# Patient Record
Sex: Female | Born: 1945 | Race: White | Hispanic: No | Smoking: Never smoker
Health system: Southern US, Community
[De-identification: ages and names within clinical notes are randomized; demographics above are authoritative.]

## PROBLEM LIST (undated history)

## (undated) DIAGNOSIS — I499 Cardiac arrhythmia, unspecified: Secondary | ICD-10-CM

## (undated) DIAGNOSIS — E785 Hyperlipidemia, unspecified: Secondary | ICD-10-CM

## (undated) DIAGNOSIS — I10 Essential (primary) hypertension: Secondary | ICD-10-CM

## (undated) DIAGNOSIS — K648 Other hemorrhoids: Secondary | ICD-10-CM

## (undated) DIAGNOSIS — C20 Malignant neoplasm of rectum: Secondary | ICD-10-CM

## (undated) DIAGNOSIS — M1990 Unspecified osteoarthritis, unspecified site: Secondary | ICD-10-CM

## (undated) DIAGNOSIS — G8929 Other chronic pain: Secondary | ICD-10-CM

## (undated) DIAGNOSIS — K219 Gastro-esophageal reflux disease without esophagitis: Secondary | ICD-10-CM

## (undated) HISTORY — DX: Gastro-esophageal reflux disease without esophagitis: K21.9

## (undated) HISTORY — PX: CHOLECYSTECTOMY: SHX55

## (undated) HISTORY — DX: Malignant neoplasm of rectum: C20

## (undated) HISTORY — DX: Hyperlipidemia, unspecified: E78.5

## (undated) HISTORY — DX: Essential (primary) hypertension: I10

## (undated) HISTORY — PX: BACK SURGERY: SHX140

## (undated) HISTORY — PX: PARTIAL KNEE ARTHROPLASTY: SHX2174

## (undated) HISTORY — DX: Unspecified osteoarthritis, unspecified site: M19.90

## (undated) HISTORY — DX: Other hemorrhoids: K64.8

## (undated) HISTORY — PX: TUBAL LIGATION: SHX77

## (undated) HISTORY — PX: COLON RESECTION: SHX5231

## (undated) HISTORY — DX: Other chronic pain: G89.29

## (undated) HISTORY — DX: Cardiac arrhythmia, unspecified: I49.9

## (undated) HISTORY — PX: EYE SURGERY: SHX253

---

## 2003-01-21 HISTORY — PX: ESOPHAGOGASTRODUODENOSCOPY: SHX1529

## 2010-10-06 DIAGNOSIS — Z85048 Personal history of other malignant neoplasm of rectum, rectosigmoid junction, and anus: Secondary | ICD-10-CM | POA: Insufficient documentation

## 2010-10-06 HISTORY — DX: Personal history of other malignant neoplasm of rectum, rectosigmoid junction, and anus: Z85.048

## 2014-05-26 DIAGNOSIS — I4891 Unspecified atrial fibrillation: Secondary | ICD-10-CM | POA: Diagnosis not present

## 2014-05-26 DIAGNOSIS — I10 Essential (primary) hypertension: Secondary | ICD-10-CM | POA: Diagnosis not present

## 2014-05-26 DIAGNOSIS — E780 Pure hypercholesterolemia: Secondary | ICD-10-CM | POA: Diagnosis not present

## 2014-05-27 DIAGNOSIS — N858 Other specified noninflammatory disorders of uterus: Secondary | ICD-10-CM | POA: Diagnosis not present

## 2014-05-27 DIAGNOSIS — N950 Postmenopausal bleeding: Secondary | ICD-10-CM | POA: Diagnosis not present

## 2014-05-27 DIAGNOSIS — I4891 Unspecified atrial fibrillation: Secondary | ICD-10-CM | POA: Diagnosis not present

## 2014-06-02 DIAGNOSIS — H259 Unspecified age-related cataract: Secondary | ICD-10-CM | POA: Diagnosis not present

## 2014-06-02 DIAGNOSIS — H2511 Age-related nuclear cataract, right eye: Secondary | ICD-10-CM | POA: Diagnosis not present

## 2014-06-02 DIAGNOSIS — I10 Essential (primary) hypertension: Secondary | ICD-10-CM | POA: Diagnosis not present

## 2014-06-02 DIAGNOSIS — H25811 Combined forms of age-related cataract, right eye: Secondary | ICD-10-CM | POA: Diagnosis not present

## 2014-06-02 DIAGNOSIS — Z79899 Other long term (current) drug therapy: Secondary | ICD-10-CM | POA: Diagnosis not present

## 2014-06-02 DIAGNOSIS — I4891 Unspecified atrial fibrillation: Secondary | ICD-10-CM | POA: Diagnosis not present

## 2014-07-14 DIAGNOSIS — H2512 Age-related nuclear cataract, left eye: Secondary | ICD-10-CM | POA: Diagnosis not present

## 2014-07-14 DIAGNOSIS — G8929 Other chronic pain: Secondary | ICD-10-CM | POA: Diagnosis not present

## 2014-07-14 DIAGNOSIS — H259 Unspecified age-related cataract: Secondary | ICD-10-CM | POA: Diagnosis not present

## 2014-07-14 DIAGNOSIS — E785 Hyperlipidemia, unspecified: Secondary | ICD-10-CM | POA: Diagnosis not present

## 2014-07-14 DIAGNOSIS — Z7982 Long term (current) use of aspirin: Secondary | ICD-10-CM | POA: Diagnosis not present

## 2014-07-14 DIAGNOSIS — H25812 Combined forms of age-related cataract, left eye: Secondary | ICD-10-CM | POA: Diagnosis not present

## 2014-07-14 DIAGNOSIS — Z79899 Other long term (current) drug therapy: Secondary | ICD-10-CM | POA: Diagnosis not present

## 2014-07-14 DIAGNOSIS — I4891 Unspecified atrial fibrillation: Secondary | ICD-10-CM | POA: Diagnosis not present

## 2014-07-14 DIAGNOSIS — K219 Gastro-esophageal reflux disease without esophagitis: Secondary | ICD-10-CM | POA: Diagnosis not present

## 2014-07-16 DIAGNOSIS — T1511XA Foreign body in conjunctival sac, right eye, initial encounter: Secondary | ICD-10-CM | POA: Diagnosis not present

## 2014-09-15 DIAGNOSIS — M5126 Other intervertebral disc displacement, lumbar region: Secondary | ICD-10-CM | POA: Diagnosis not present

## 2014-09-15 DIAGNOSIS — E780 Pure hypercholesterolemia: Secondary | ICD-10-CM | POA: Diagnosis not present

## 2014-09-15 DIAGNOSIS — I4891 Unspecified atrial fibrillation: Secondary | ICD-10-CM | POA: Diagnosis not present

## 2014-09-15 DIAGNOSIS — I10 Essential (primary) hypertension: Secondary | ICD-10-CM | POA: Diagnosis not present

## 2014-11-16 DIAGNOSIS — E669 Obesity, unspecified: Secondary | ICD-10-CM | POA: Diagnosis not present

## 2014-11-16 DIAGNOSIS — M4806 Spinal stenosis, lumbar region: Secondary | ICD-10-CM | POA: Diagnosis not present

## 2014-11-16 DIAGNOSIS — Z23 Encounter for immunization: Secondary | ICD-10-CM | POA: Diagnosis not present

## 2014-11-16 DIAGNOSIS — I4891 Unspecified atrial fibrillation: Secondary | ICD-10-CM | POA: Diagnosis not present

## 2014-11-16 DIAGNOSIS — Z6834 Body mass index (BMI) 34.0-34.9, adult: Secondary | ICD-10-CM | POA: Diagnosis not present

## 2014-11-16 DIAGNOSIS — Z0000 Encounter for general adult medical examination without abnormal findings: Secondary | ICD-10-CM | POA: Diagnosis not present

## 2014-11-25 DIAGNOSIS — Z1231 Encounter for screening mammogram for malignant neoplasm of breast: Secondary | ICD-10-CM | POA: Diagnosis not present

## 2014-12-10 DIAGNOSIS — M1712 Unilateral primary osteoarthritis, left knee: Secondary | ICD-10-CM | POA: Diagnosis not present

## 2014-12-10 DIAGNOSIS — M25562 Pain in left knee: Secondary | ICD-10-CM | POA: Diagnosis not present

## 2014-12-11 DIAGNOSIS — M1712 Unilateral primary osteoarthritis, left knee: Secondary | ICD-10-CM

## 2014-12-11 HISTORY — DX: Unilateral primary osteoarthritis, left knee: M17.12

## 2014-12-15 DIAGNOSIS — I119 Hypertensive heart disease without heart failure: Secondary | ICD-10-CM | POA: Diagnosis not present

## 2014-12-15 DIAGNOSIS — I482 Chronic atrial fibrillation: Secondary | ICD-10-CM | POA: Diagnosis not present

## 2014-12-15 DIAGNOSIS — Z01810 Encounter for preprocedural cardiovascular examination: Secondary | ICD-10-CM | POA: Diagnosis not present

## 2014-12-30 DIAGNOSIS — Z01810 Encounter for preprocedural cardiovascular examination: Secondary | ICD-10-CM | POA: Diagnosis not present

## 2014-12-30 DIAGNOSIS — Z23 Encounter for immunization: Secondary | ICD-10-CM | POA: Diagnosis not present

## 2014-12-30 DIAGNOSIS — M1712 Unilateral primary osteoarthritis, left knee: Secondary | ICD-10-CM | POA: Diagnosis not present

## 2014-12-30 DIAGNOSIS — I10 Essential (primary) hypertension: Secondary | ICD-10-CM | POA: Diagnosis not present

## 2015-01-20 DIAGNOSIS — M25562 Pain in left knee: Secondary | ICD-10-CM | POA: Diagnosis not present

## 2015-01-20 DIAGNOSIS — M1712 Unilateral primary osteoarthritis, left knee: Secondary | ICD-10-CM | POA: Diagnosis not present

## 2015-01-20 DIAGNOSIS — Z01818 Encounter for other preprocedural examination: Secondary | ICD-10-CM

## 2015-01-20 HISTORY — DX: Encounter for other preprocedural examination: Z01.818

## 2015-02-01 DIAGNOSIS — M1712 Unilateral primary osteoarthritis, left knee: Secondary | ICD-10-CM | POA: Diagnosis not present

## 2015-02-09 DIAGNOSIS — E785 Hyperlipidemia, unspecified: Secondary | ICD-10-CM | POA: Diagnosis not present

## 2015-02-09 DIAGNOSIS — M1712 Unilateral primary osteoarthritis, left knee: Secondary | ICD-10-CM | POA: Diagnosis not present

## 2015-02-09 DIAGNOSIS — R262 Difficulty in walking, not elsewhere classified: Secondary | ICD-10-CM | POA: Diagnosis not present

## 2015-02-09 DIAGNOSIS — N3281 Overactive bladder: Secondary | ICD-10-CM | POA: Diagnosis not present

## 2015-02-09 DIAGNOSIS — K219 Gastro-esophageal reflux disease without esophagitis: Secondary | ICD-10-CM | POA: Diagnosis not present

## 2015-02-09 DIAGNOSIS — Z7982 Long term (current) use of aspirin: Secondary | ICD-10-CM | POA: Diagnosis not present

## 2015-02-09 DIAGNOSIS — Z85048 Personal history of other malignant neoplasm of rectum, rectosigmoid junction, and anus: Secondary | ICD-10-CM | POA: Diagnosis not present

## 2015-02-09 DIAGNOSIS — Z96652 Presence of left artificial knee joint: Secondary | ICD-10-CM | POA: Diagnosis not present

## 2015-02-09 DIAGNOSIS — I10 Essential (primary) hypertension: Secondary | ICD-10-CM | POA: Diagnosis not present

## 2015-02-09 DIAGNOSIS — Z471 Aftercare following joint replacement surgery: Secondary | ICD-10-CM | POA: Diagnosis not present

## 2015-02-09 DIAGNOSIS — I119 Hypertensive heart disease without heart failure: Secondary | ICD-10-CM | POA: Diagnosis not present

## 2015-02-09 DIAGNOSIS — I482 Chronic atrial fibrillation: Secondary | ICD-10-CM | POA: Diagnosis not present

## 2015-02-12 DIAGNOSIS — I10 Essential (primary) hypertension: Secondary | ICD-10-CM | POA: Diagnosis not present

## 2015-02-12 DIAGNOSIS — G8918 Other acute postprocedural pain: Secondary | ICD-10-CM | POA: Diagnosis not present

## 2015-02-12 DIAGNOSIS — Z4789 Encounter for other orthopedic aftercare: Secondary | ICD-10-CM | POA: Diagnosis not present

## 2015-02-12 DIAGNOSIS — M1712 Unilateral primary osteoarthritis, left knee: Secondary | ICD-10-CM | POA: Diagnosis not present

## 2015-02-12 DIAGNOSIS — Z96652 Presence of left artificial knee joint: Secondary | ICD-10-CM | POA: Diagnosis not present

## 2015-02-12 DIAGNOSIS — I119 Hypertensive heart disease without heart failure: Secondary | ICD-10-CM | POA: Diagnosis not present

## 2015-02-12 DIAGNOSIS — R262 Difficulty in walking, not elsewhere classified: Secondary | ICD-10-CM | POA: Diagnosis not present

## 2015-02-12 DIAGNOSIS — Z471 Aftercare following joint replacement surgery: Secondary | ICD-10-CM | POA: Diagnosis not present

## 2015-02-15 DIAGNOSIS — Z4789 Encounter for other orthopedic aftercare: Secondary | ICD-10-CM | POA: Diagnosis not present

## 2015-02-15 DIAGNOSIS — R262 Difficulty in walking, not elsewhere classified: Secondary | ICD-10-CM | POA: Diagnosis not present

## 2015-02-15 DIAGNOSIS — G8918 Other acute postprocedural pain: Secondary | ICD-10-CM | POA: Diagnosis not present

## 2015-02-15 DIAGNOSIS — I119 Hypertensive heart disease without heart failure: Secondary | ICD-10-CM | POA: Diagnosis not present

## 2015-02-23 DIAGNOSIS — Z96652 Presence of left artificial knee joint: Secondary | ICD-10-CM | POA: Diagnosis not present

## 2015-02-23 DIAGNOSIS — Z471 Aftercare following joint replacement surgery: Secondary | ICD-10-CM | POA: Diagnosis not present

## 2015-02-24 DIAGNOSIS — M25562 Pain in left knee: Secondary | ICD-10-CM | POA: Diagnosis not present

## 2015-02-24 DIAGNOSIS — M25662 Stiffness of left knee, not elsewhere classified: Secondary | ICD-10-CM | POA: Diagnosis not present

## 2015-02-26 DIAGNOSIS — M25662 Stiffness of left knee, not elsewhere classified: Secondary | ICD-10-CM | POA: Diagnosis not present

## 2015-02-26 DIAGNOSIS — M25562 Pain in left knee: Secondary | ICD-10-CM | POA: Diagnosis not present

## 2015-03-01 DIAGNOSIS — M25562 Pain in left knee: Secondary | ICD-10-CM | POA: Diagnosis not present

## 2015-03-01 DIAGNOSIS — M25662 Stiffness of left knee, not elsewhere classified: Secondary | ICD-10-CM | POA: Diagnosis not present

## 2015-03-03 DIAGNOSIS — M25562 Pain in left knee: Secondary | ICD-10-CM | POA: Diagnosis not present

## 2015-03-03 DIAGNOSIS — M25662 Stiffness of left knee, not elsewhere classified: Secondary | ICD-10-CM | POA: Diagnosis not present

## 2015-03-05 DIAGNOSIS — M25662 Stiffness of left knee, not elsewhere classified: Secondary | ICD-10-CM | POA: Diagnosis not present

## 2015-03-05 DIAGNOSIS — M25562 Pain in left knee: Secondary | ICD-10-CM | POA: Diagnosis not present

## 2015-03-08 DIAGNOSIS — M25662 Stiffness of left knee, not elsewhere classified: Secondary | ICD-10-CM | POA: Diagnosis not present

## 2015-03-08 DIAGNOSIS — I10 Essential (primary) hypertension: Secondary | ICD-10-CM | POA: Diagnosis not present

## 2015-03-08 DIAGNOSIS — M25562 Pain in left knee: Secondary | ICD-10-CM | POA: Diagnosis not present

## 2015-03-08 DIAGNOSIS — I4891 Unspecified atrial fibrillation: Secondary | ICD-10-CM | POA: Diagnosis not present

## 2015-03-08 DIAGNOSIS — Z23 Encounter for immunization: Secondary | ICD-10-CM | POA: Diagnosis not present

## 2015-03-08 DIAGNOSIS — M1712 Unilateral primary osteoarthritis, left knee: Secondary | ICD-10-CM | POA: Diagnosis not present

## 2015-03-08 DIAGNOSIS — E7800 Pure hypercholesterolemia, unspecified: Secondary | ICD-10-CM | POA: Diagnosis not present

## 2015-03-10 DIAGNOSIS — M25662 Stiffness of left knee, not elsewhere classified: Secondary | ICD-10-CM | POA: Diagnosis not present

## 2015-03-10 DIAGNOSIS — M25562 Pain in left knee: Secondary | ICD-10-CM | POA: Diagnosis not present

## 2015-03-12 DIAGNOSIS — M25662 Stiffness of left knee, not elsewhere classified: Secondary | ICD-10-CM | POA: Diagnosis not present

## 2015-03-12 DIAGNOSIS — M25562 Pain in left knee: Secondary | ICD-10-CM | POA: Diagnosis not present

## 2015-03-15 DIAGNOSIS — M25562 Pain in left knee: Secondary | ICD-10-CM | POA: Diagnosis not present

## 2015-03-15 DIAGNOSIS — M25662 Stiffness of left knee, not elsewhere classified: Secondary | ICD-10-CM | POA: Diagnosis not present

## 2015-03-17 DIAGNOSIS — M25662 Stiffness of left knee, not elsewhere classified: Secondary | ICD-10-CM | POA: Diagnosis not present

## 2015-03-17 DIAGNOSIS — M25562 Pain in left knee: Secondary | ICD-10-CM | POA: Diagnosis not present

## 2015-03-19 DIAGNOSIS — M25662 Stiffness of left knee, not elsewhere classified: Secondary | ICD-10-CM | POA: Diagnosis not present

## 2015-03-19 DIAGNOSIS — M25562 Pain in left knee: Secondary | ICD-10-CM | POA: Diagnosis not present

## 2015-03-22 DIAGNOSIS — M25662 Stiffness of left knee, not elsewhere classified: Secondary | ICD-10-CM | POA: Diagnosis not present

## 2015-03-22 DIAGNOSIS — M25562 Pain in left knee: Secondary | ICD-10-CM | POA: Diagnosis not present

## 2015-03-25 DIAGNOSIS — M25562 Pain in left knee: Secondary | ICD-10-CM | POA: Diagnosis not present

## 2015-03-25 DIAGNOSIS — M25662 Stiffness of left knee, not elsewhere classified: Secondary | ICD-10-CM | POA: Diagnosis not present

## 2015-03-29 DIAGNOSIS — M25562 Pain in left knee: Secondary | ICD-10-CM | POA: Diagnosis not present

## 2015-03-29 DIAGNOSIS — M25662 Stiffness of left knee, not elsewhere classified: Secondary | ICD-10-CM | POA: Diagnosis not present

## 2015-04-01 DIAGNOSIS — M25562 Pain in left knee: Secondary | ICD-10-CM | POA: Diagnosis not present

## 2015-04-01 DIAGNOSIS — M25662 Stiffness of left knee, not elsewhere classified: Secondary | ICD-10-CM | POA: Diagnosis not present

## 2015-04-05 DIAGNOSIS — M25662 Stiffness of left knee, not elsewhere classified: Secondary | ICD-10-CM | POA: Diagnosis not present

## 2015-04-05 DIAGNOSIS — M25562 Pain in left knee: Secondary | ICD-10-CM | POA: Diagnosis not present

## 2015-04-08 DIAGNOSIS — M25662 Stiffness of left knee, not elsewhere classified: Secondary | ICD-10-CM | POA: Diagnosis not present

## 2015-04-08 DIAGNOSIS — M25562 Pain in left knee: Secondary | ICD-10-CM | POA: Diagnosis not present

## 2015-04-12 DIAGNOSIS — M25662 Stiffness of left knee, not elsewhere classified: Secondary | ICD-10-CM | POA: Diagnosis not present

## 2015-04-12 DIAGNOSIS — M25562 Pain in left knee: Secondary | ICD-10-CM | POA: Diagnosis not present

## 2015-04-16 DIAGNOSIS — M25562 Pain in left knee: Secondary | ICD-10-CM | POA: Diagnosis not present

## 2015-04-16 DIAGNOSIS — M25662 Stiffness of left knee, not elsewhere classified: Secondary | ICD-10-CM | POA: Diagnosis not present

## 2015-04-19 DIAGNOSIS — M25662 Stiffness of left knee, not elsewhere classified: Secondary | ICD-10-CM | POA: Diagnosis not present

## 2015-04-19 DIAGNOSIS — M25562 Pain in left knee: Secondary | ICD-10-CM | POA: Diagnosis not present

## 2015-06-03 DIAGNOSIS — I10 Essential (primary) hypertension: Secondary | ICD-10-CM | POA: Diagnosis not present

## 2015-06-03 DIAGNOSIS — E7800 Pure hypercholesterolemia, unspecified: Secondary | ICD-10-CM | POA: Diagnosis not present

## 2015-06-03 DIAGNOSIS — Z79899 Other long term (current) drug therapy: Secondary | ICD-10-CM | POA: Diagnosis not present

## 2015-09-17 DIAGNOSIS — M4806 Spinal stenosis, lumbar region: Secondary | ICD-10-CM | POA: Diagnosis not present

## 2015-09-17 DIAGNOSIS — Z79899 Other long term (current) drug therapy: Secondary | ICD-10-CM | POA: Diagnosis not present

## 2015-09-17 DIAGNOSIS — I10 Essential (primary) hypertension: Secondary | ICD-10-CM | POA: Diagnosis not present

## 2015-09-17 DIAGNOSIS — M545 Low back pain: Secondary | ICD-10-CM | POA: Diagnosis not present

## 2015-09-17 DIAGNOSIS — Z85038 Personal history of other malignant neoplasm of large intestine: Secondary | ICD-10-CM | POA: Diagnosis not present

## 2015-09-17 DIAGNOSIS — M5440 Lumbago with sciatica, unspecified side: Secondary | ICD-10-CM | POA: Diagnosis not present

## 2015-09-17 DIAGNOSIS — E7800 Pure hypercholesterolemia, unspecified: Secondary | ICD-10-CM | POA: Diagnosis not present

## 2015-10-05 DIAGNOSIS — M5386 Other specified dorsopathies, lumbar region: Secondary | ICD-10-CM | POA: Diagnosis not present

## 2015-10-05 DIAGNOSIS — M9905 Segmental and somatic dysfunction of pelvic region: Secondary | ICD-10-CM | POA: Diagnosis not present

## 2015-10-05 DIAGNOSIS — M5136 Other intervertebral disc degeneration, lumbar region: Secondary | ICD-10-CM | POA: Diagnosis not present

## 2015-10-05 DIAGNOSIS — M9903 Segmental and somatic dysfunction of lumbar region: Secondary | ICD-10-CM | POA: Diagnosis not present

## 2015-10-05 DIAGNOSIS — M9901 Segmental and somatic dysfunction of cervical region: Secondary | ICD-10-CM | POA: Diagnosis not present

## 2015-10-05 DIAGNOSIS — M9902 Segmental and somatic dysfunction of thoracic region: Secondary | ICD-10-CM | POA: Diagnosis not present

## 2015-10-05 DIAGNOSIS — M4806 Spinal stenosis, lumbar region: Secondary | ICD-10-CM | POA: Diagnosis not present

## 2015-10-07 DIAGNOSIS — M4806 Spinal stenosis, lumbar region: Secondary | ICD-10-CM | POA: Diagnosis not present

## 2015-10-07 DIAGNOSIS — M5136 Other intervertebral disc degeneration, lumbar region: Secondary | ICD-10-CM | POA: Diagnosis not present

## 2015-10-07 DIAGNOSIS — M5386 Other specified dorsopathies, lumbar region: Secondary | ICD-10-CM | POA: Diagnosis not present

## 2015-10-07 DIAGNOSIS — M9903 Segmental and somatic dysfunction of lumbar region: Secondary | ICD-10-CM | POA: Diagnosis not present

## 2015-10-07 DIAGNOSIS — M9902 Segmental and somatic dysfunction of thoracic region: Secondary | ICD-10-CM | POA: Diagnosis not present

## 2015-10-07 DIAGNOSIS — M9901 Segmental and somatic dysfunction of cervical region: Secondary | ICD-10-CM | POA: Diagnosis not present

## 2015-10-07 DIAGNOSIS — M9905 Segmental and somatic dysfunction of pelvic region: Secondary | ICD-10-CM | POA: Diagnosis not present

## 2015-10-11 DIAGNOSIS — M4806 Spinal stenosis, lumbar region: Secondary | ICD-10-CM | POA: Diagnosis not present

## 2015-10-11 DIAGNOSIS — M5386 Other specified dorsopathies, lumbar region: Secondary | ICD-10-CM | POA: Diagnosis not present

## 2015-10-11 DIAGNOSIS — M5136 Other intervertebral disc degeneration, lumbar region: Secondary | ICD-10-CM | POA: Diagnosis not present

## 2015-10-11 DIAGNOSIS — M9902 Segmental and somatic dysfunction of thoracic region: Secondary | ICD-10-CM | POA: Diagnosis not present

## 2015-10-11 DIAGNOSIS — M9905 Segmental and somatic dysfunction of pelvic region: Secondary | ICD-10-CM | POA: Diagnosis not present

## 2015-10-11 DIAGNOSIS — M9903 Segmental and somatic dysfunction of lumbar region: Secondary | ICD-10-CM | POA: Diagnosis not present

## 2015-10-11 DIAGNOSIS — M9901 Segmental and somatic dysfunction of cervical region: Secondary | ICD-10-CM | POA: Diagnosis not present

## 2015-10-12 DIAGNOSIS — M9903 Segmental and somatic dysfunction of lumbar region: Secondary | ICD-10-CM | POA: Diagnosis not present

## 2015-10-12 DIAGNOSIS — M4806 Spinal stenosis, lumbar region: Secondary | ICD-10-CM | POA: Diagnosis not present

## 2015-10-12 DIAGNOSIS — M5136 Other intervertebral disc degeneration, lumbar region: Secondary | ICD-10-CM | POA: Diagnosis not present

## 2015-10-12 DIAGNOSIS — M9905 Segmental and somatic dysfunction of pelvic region: Secondary | ICD-10-CM | POA: Diagnosis not present

## 2015-10-12 DIAGNOSIS — M5386 Other specified dorsopathies, lumbar region: Secondary | ICD-10-CM | POA: Diagnosis not present

## 2015-10-12 DIAGNOSIS — M9901 Segmental and somatic dysfunction of cervical region: Secondary | ICD-10-CM | POA: Diagnosis not present

## 2015-10-12 DIAGNOSIS — M9902 Segmental and somatic dysfunction of thoracic region: Secondary | ICD-10-CM | POA: Diagnosis not present

## 2015-10-14 DIAGNOSIS — M5386 Other specified dorsopathies, lumbar region: Secondary | ICD-10-CM | POA: Diagnosis not present

## 2015-10-14 DIAGNOSIS — M9903 Segmental and somatic dysfunction of lumbar region: Secondary | ICD-10-CM | POA: Diagnosis not present

## 2015-10-14 DIAGNOSIS — M9905 Segmental and somatic dysfunction of pelvic region: Secondary | ICD-10-CM | POA: Diagnosis not present

## 2015-10-14 DIAGNOSIS — M5136 Other intervertebral disc degeneration, lumbar region: Secondary | ICD-10-CM | POA: Diagnosis not present

## 2015-10-14 DIAGNOSIS — M9902 Segmental and somatic dysfunction of thoracic region: Secondary | ICD-10-CM | POA: Diagnosis not present

## 2015-10-14 DIAGNOSIS — M4806 Spinal stenosis, lumbar region: Secondary | ICD-10-CM | POA: Diagnosis not present

## 2015-10-14 DIAGNOSIS — M9901 Segmental and somatic dysfunction of cervical region: Secondary | ICD-10-CM | POA: Diagnosis not present

## 2015-10-19 DIAGNOSIS — M4806 Spinal stenosis, lumbar region: Secondary | ICD-10-CM | POA: Diagnosis not present

## 2015-10-19 DIAGNOSIS — M5136 Other intervertebral disc degeneration, lumbar region: Secondary | ICD-10-CM | POA: Diagnosis not present

## 2015-10-19 DIAGNOSIS — M9905 Segmental and somatic dysfunction of pelvic region: Secondary | ICD-10-CM | POA: Diagnosis not present

## 2015-10-19 DIAGNOSIS — M9903 Segmental and somatic dysfunction of lumbar region: Secondary | ICD-10-CM | POA: Diagnosis not present

## 2015-10-19 DIAGNOSIS — M9902 Segmental and somatic dysfunction of thoracic region: Secondary | ICD-10-CM | POA: Diagnosis not present

## 2015-10-19 DIAGNOSIS — M5386 Other specified dorsopathies, lumbar region: Secondary | ICD-10-CM | POA: Diagnosis not present

## 2015-10-19 DIAGNOSIS — M9901 Segmental and somatic dysfunction of cervical region: Secondary | ICD-10-CM | POA: Diagnosis not present

## 2015-10-20 DIAGNOSIS — M9901 Segmental and somatic dysfunction of cervical region: Secondary | ICD-10-CM | POA: Diagnosis not present

## 2015-10-20 DIAGNOSIS — M9903 Segmental and somatic dysfunction of lumbar region: Secondary | ICD-10-CM | POA: Diagnosis not present

## 2015-10-20 DIAGNOSIS — M9902 Segmental and somatic dysfunction of thoracic region: Secondary | ICD-10-CM | POA: Diagnosis not present

## 2015-10-20 DIAGNOSIS — M5386 Other specified dorsopathies, lumbar region: Secondary | ICD-10-CM | POA: Diagnosis not present

## 2015-10-20 DIAGNOSIS — M9905 Segmental and somatic dysfunction of pelvic region: Secondary | ICD-10-CM | POA: Diagnosis not present

## 2015-10-20 DIAGNOSIS — M4806 Spinal stenosis, lumbar region: Secondary | ICD-10-CM | POA: Diagnosis not present

## 2015-10-20 DIAGNOSIS — M5136 Other intervertebral disc degeneration, lumbar region: Secondary | ICD-10-CM | POA: Diagnosis not present

## 2015-10-21 DIAGNOSIS — M5136 Other intervertebral disc degeneration, lumbar region: Secondary | ICD-10-CM | POA: Diagnosis not present

## 2015-10-21 DIAGNOSIS — M9905 Segmental and somatic dysfunction of pelvic region: Secondary | ICD-10-CM | POA: Diagnosis not present

## 2015-10-21 DIAGNOSIS — M9902 Segmental and somatic dysfunction of thoracic region: Secondary | ICD-10-CM | POA: Diagnosis not present

## 2015-10-21 DIAGNOSIS — M4806 Spinal stenosis, lumbar region: Secondary | ICD-10-CM | POA: Diagnosis not present

## 2015-10-21 DIAGNOSIS — M9901 Segmental and somatic dysfunction of cervical region: Secondary | ICD-10-CM | POA: Diagnosis not present

## 2015-10-21 DIAGNOSIS — M5386 Other specified dorsopathies, lumbar region: Secondary | ICD-10-CM | POA: Diagnosis not present

## 2015-10-21 DIAGNOSIS — M9903 Segmental and somatic dysfunction of lumbar region: Secondary | ICD-10-CM | POA: Diagnosis not present

## 2015-10-25 DIAGNOSIS — M9902 Segmental and somatic dysfunction of thoracic region: Secondary | ICD-10-CM | POA: Diagnosis not present

## 2015-10-25 DIAGNOSIS — M5136 Other intervertebral disc degeneration, lumbar region: Secondary | ICD-10-CM | POA: Diagnosis not present

## 2015-10-25 DIAGNOSIS — M5386 Other specified dorsopathies, lumbar region: Secondary | ICD-10-CM | POA: Diagnosis not present

## 2015-10-25 DIAGNOSIS — M9903 Segmental and somatic dysfunction of lumbar region: Secondary | ICD-10-CM | POA: Diagnosis not present

## 2015-10-25 DIAGNOSIS — M9905 Segmental and somatic dysfunction of pelvic region: Secondary | ICD-10-CM | POA: Diagnosis not present

## 2015-10-25 DIAGNOSIS — M9901 Segmental and somatic dysfunction of cervical region: Secondary | ICD-10-CM | POA: Diagnosis not present

## 2015-10-25 DIAGNOSIS — M4806 Spinal stenosis, lumbar region: Secondary | ICD-10-CM | POA: Diagnosis not present

## 2015-10-26 DIAGNOSIS — M9905 Segmental and somatic dysfunction of pelvic region: Secondary | ICD-10-CM | POA: Diagnosis not present

## 2015-10-26 DIAGNOSIS — M9903 Segmental and somatic dysfunction of lumbar region: Secondary | ICD-10-CM | POA: Diagnosis not present

## 2015-10-26 DIAGNOSIS — M9901 Segmental and somatic dysfunction of cervical region: Secondary | ICD-10-CM | POA: Diagnosis not present

## 2015-10-26 DIAGNOSIS — M5136 Other intervertebral disc degeneration, lumbar region: Secondary | ICD-10-CM | POA: Diagnosis not present

## 2015-10-26 DIAGNOSIS — M4806 Spinal stenosis, lumbar region: Secondary | ICD-10-CM | POA: Diagnosis not present

## 2015-10-26 DIAGNOSIS — M5386 Other specified dorsopathies, lumbar region: Secondary | ICD-10-CM | POA: Diagnosis not present

## 2015-10-26 DIAGNOSIS — M9902 Segmental and somatic dysfunction of thoracic region: Secondary | ICD-10-CM | POA: Diagnosis not present

## 2015-10-28 DIAGNOSIS — M4806 Spinal stenosis, lumbar region: Secondary | ICD-10-CM | POA: Diagnosis not present

## 2015-10-28 DIAGNOSIS — M9905 Segmental and somatic dysfunction of pelvic region: Secondary | ICD-10-CM | POA: Diagnosis not present

## 2015-10-28 DIAGNOSIS — M9901 Segmental and somatic dysfunction of cervical region: Secondary | ICD-10-CM | POA: Diagnosis not present

## 2015-10-28 DIAGNOSIS — M9902 Segmental and somatic dysfunction of thoracic region: Secondary | ICD-10-CM | POA: Diagnosis not present

## 2015-10-28 DIAGNOSIS — M5136 Other intervertebral disc degeneration, lumbar region: Secondary | ICD-10-CM | POA: Diagnosis not present

## 2015-10-28 DIAGNOSIS — M5386 Other specified dorsopathies, lumbar region: Secondary | ICD-10-CM | POA: Diagnosis not present

## 2015-10-28 DIAGNOSIS — M9903 Segmental and somatic dysfunction of lumbar region: Secondary | ICD-10-CM | POA: Diagnosis not present

## 2015-11-02 DIAGNOSIS — M4806 Spinal stenosis, lumbar region: Secondary | ICD-10-CM | POA: Diagnosis not present

## 2015-11-02 DIAGNOSIS — M5386 Other specified dorsopathies, lumbar region: Secondary | ICD-10-CM | POA: Diagnosis not present

## 2015-11-02 DIAGNOSIS — M9903 Segmental and somatic dysfunction of lumbar region: Secondary | ICD-10-CM | POA: Diagnosis not present

## 2015-11-02 DIAGNOSIS — M9905 Segmental and somatic dysfunction of pelvic region: Secondary | ICD-10-CM | POA: Diagnosis not present

## 2015-11-02 DIAGNOSIS — M9901 Segmental and somatic dysfunction of cervical region: Secondary | ICD-10-CM | POA: Diagnosis not present

## 2015-11-02 DIAGNOSIS — M9902 Segmental and somatic dysfunction of thoracic region: Secondary | ICD-10-CM | POA: Diagnosis not present

## 2015-11-02 DIAGNOSIS — M5136 Other intervertebral disc degeneration, lumbar region: Secondary | ICD-10-CM | POA: Diagnosis not present

## 2015-11-04 DIAGNOSIS — M9905 Segmental and somatic dysfunction of pelvic region: Secondary | ICD-10-CM | POA: Diagnosis not present

## 2015-11-04 DIAGNOSIS — M4806 Spinal stenosis, lumbar region: Secondary | ICD-10-CM | POA: Diagnosis not present

## 2015-11-04 DIAGNOSIS — M5136 Other intervertebral disc degeneration, lumbar region: Secondary | ICD-10-CM | POA: Diagnosis not present

## 2015-11-04 DIAGNOSIS — M9903 Segmental and somatic dysfunction of lumbar region: Secondary | ICD-10-CM | POA: Diagnosis not present

## 2015-11-04 DIAGNOSIS — M5386 Other specified dorsopathies, lumbar region: Secondary | ICD-10-CM | POA: Diagnosis not present

## 2015-11-04 DIAGNOSIS — M9902 Segmental and somatic dysfunction of thoracic region: Secondary | ICD-10-CM | POA: Diagnosis not present

## 2015-11-04 DIAGNOSIS — M9901 Segmental and somatic dysfunction of cervical region: Secondary | ICD-10-CM | POA: Diagnosis not present

## 2015-11-08 DIAGNOSIS — M5386 Other specified dorsopathies, lumbar region: Secondary | ICD-10-CM | POA: Diagnosis not present

## 2015-11-08 DIAGNOSIS — M4806 Spinal stenosis, lumbar region: Secondary | ICD-10-CM | POA: Diagnosis not present

## 2015-11-08 DIAGNOSIS — M5136 Other intervertebral disc degeneration, lumbar region: Secondary | ICD-10-CM | POA: Diagnosis not present

## 2015-11-08 DIAGNOSIS — M9903 Segmental and somatic dysfunction of lumbar region: Secondary | ICD-10-CM | POA: Diagnosis not present

## 2015-11-08 DIAGNOSIS — M9902 Segmental and somatic dysfunction of thoracic region: Secondary | ICD-10-CM | POA: Diagnosis not present

## 2015-11-08 DIAGNOSIS — M9905 Segmental and somatic dysfunction of pelvic region: Secondary | ICD-10-CM | POA: Diagnosis not present

## 2015-11-08 DIAGNOSIS — M9901 Segmental and somatic dysfunction of cervical region: Secondary | ICD-10-CM | POA: Diagnosis not present

## 2015-11-11 DIAGNOSIS — M9902 Segmental and somatic dysfunction of thoracic region: Secondary | ICD-10-CM | POA: Diagnosis not present

## 2015-11-11 DIAGNOSIS — M5386 Other specified dorsopathies, lumbar region: Secondary | ICD-10-CM | POA: Diagnosis not present

## 2015-11-11 DIAGNOSIS — M9903 Segmental and somatic dysfunction of lumbar region: Secondary | ICD-10-CM | POA: Diagnosis not present

## 2015-11-11 DIAGNOSIS — M5136 Other intervertebral disc degeneration, lumbar region: Secondary | ICD-10-CM | POA: Diagnosis not present

## 2015-11-11 DIAGNOSIS — M4806 Spinal stenosis, lumbar region: Secondary | ICD-10-CM | POA: Diagnosis not present

## 2015-11-11 DIAGNOSIS — M9901 Segmental and somatic dysfunction of cervical region: Secondary | ICD-10-CM | POA: Diagnosis not present

## 2015-11-11 DIAGNOSIS — M9905 Segmental and somatic dysfunction of pelvic region: Secondary | ICD-10-CM | POA: Diagnosis not present

## 2015-11-15 DIAGNOSIS — M5386 Other specified dorsopathies, lumbar region: Secondary | ICD-10-CM | POA: Diagnosis not present

## 2015-11-15 DIAGNOSIS — M5136 Other intervertebral disc degeneration, lumbar region: Secondary | ICD-10-CM | POA: Diagnosis not present

## 2015-11-15 DIAGNOSIS — M9901 Segmental and somatic dysfunction of cervical region: Secondary | ICD-10-CM | POA: Diagnosis not present

## 2015-11-15 DIAGNOSIS — M4806 Spinal stenosis, lumbar region: Secondary | ICD-10-CM | POA: Diagnosis not present

## 2015-11-15 DIAGNOSIS — M9902 Segmental and somatic dysfunction of thoracic region: Secondary | ICD-10-CM | POA: Diagnosis not present

## 2015-11-15 DIAGNOSIS — M9903 Segmental and somatic dysfunction of lumbar region: Secondary | ICD-10-CM | POA: Diagnosis not present

## 2015-11-15 DIAGNOSIS — M9905 Segmental and somatic dysfunction of pelvic region: Secondary | ICD-10-CM | POA: Diagnosis not present

## 2015-12-30 DIAGNOSIS — I4891 Unspecified atrial fibrillation: Secondary | ICD-10-CM | POA: Diagnosis not present

## 2015-12-30 DIAGNOSIS — E669 Obesity, unspecified: Secondary | ICD-10-CM | POA: Diagnosis not present

## 2015-12-30 DIAGNOSIS — Z0000 Encounter for general adult medical examination without abnormal findings: Secondary | ICD-10-CM | POA: Diagnosis not present

## 2015-12-30 DIAGNOSIS — Z6833 Body mass index (BMI) 33.0-33.9, adult: Secondary | ICD-10-CM | POA: Diagnosis not present

## 2015-12-30 DIAGNOSIS — I10 Essential (primary) hypertension: Secondary | ICD-10-CM | POA: Diagnosis not present

## 2015-12-30 DIAGNOSIS — E7800 Pure hypercholesterolemia, unspecified: Secondary | ICD-10-CM | POA: Diagnosis not present

## 2015-12-30 DIAGNOSIS — M4806 Spinal stenosis, lumbar region: Secondary | ICD-10-CM | POA: Diagnosis not present

## 2015-12-31 DIAGNOSIS — Z1231 Encounter for screening mammogram for malignant neoplasm of breast: Secondary | ICD-10-CM | POA: Diagnosis not present

## 2016-01-06 DIAGNOSIS — I119 Hypertensive heart disease without heart failure: Secondary | ICD-10-CM | POA: Diagnosis not present

## 2016-01-06 DIAGNOSIS — I480 Paroxysmal atrial fibrillation: Secondary | ICD-10-CM | POA: Diagnosis not present

## 2016-01-12 DIAGNOSIS — M4316 Spondylolisthesis, lumbar region: Secondary | ICD-10-CM | POA: Diagnosis not present

## 2016-01-12 DIAGNOSIS — M5416 Radiculopathy, lumbar region: Secondary | ICD-10-CM | POA: Diagnosis not present

## 2016-01-12 DIAGNOSIS — M549 Dorsalgia, unspecified: Secondary | ICD-10-CM | POA: Diagnosis not present

## 2016-01-12 DIAGNOSIS — M4726 Other spondylosis with radiculopathy, lumbar region: Secondary | ICD-10-CM | POA: Diagnosis not present

## 2016-01-12 DIAGNOSIS — M4806 Spinal stenosis, lumbar region: Secondary | ICD-10-CM | POA: Diagnosis not present

## 2016-02-10 DIAGNOSIS — M545 Low back pain: Secondary | ICD-10-CM | POA: Diagnosis not present

## 2016-02-10 DIAGNOSIS — M4806 Spinal stenosis, lumbar region: Secondary | ICD-10-CM | POA: Diagnosis not present

## 2016-02-10 DIAGNOSIS — M5126 Other intervertebral disc displacement, lumbar region: Secondary | ICD-10-CM | POA: Diagnosis not present

## 2016-02-18 DIAGNOSIS — M4806 Spinal stenosis, lumbar region: Secondary | ICD-10-CM | POA: Diagnosis not present

## 2016-02-18 DIAGNOSIS — Z4689 Encounter for fitting and adjustment of other specified devices: Secondary | ICD-10-CM | POA: Diagnosis not present

## 2016-02-18 DIAGNOSIS — M5416 Radiculopathy, lumbar region: Secondary | ICD-10-CM | POA: Diagnosis not present

## 2016-02-18 DIAGNOSIS — M545 Low back pain: Secondary | ICD-10-CM | POA: Diagnosis not present

## 2016-02-18 DIAGNOSIS — M4726 Other spondylosis with radiculopathy, lumbar region: Secondary | ICD-10-CM | POA: Diagnosis not present

## 2016-02-18 DIAGNOSIS — Z01818 Encounter for other preprocedural examination: Secondary | ICD-10-CM | POA: Diagnosis not present

## 2016-02-18 DIAGNOSIS — M4316 Spondylolisthesis, lumbar region: Secondary | ICD-10-CM | POA: Diagnosis not present

## 2016-02-20 HISTORY — PX: BACK SURGERY: SHX140

## 2016-02-29 DIAGNOSIS — M4316 Spondylolisthesis, lumbar region: Secondary | ICD-10-CM | POA: Diagnosis not present

## 2016-02-29 DIAGNOSIS — Z86010 Personal history of colonic polyps: Secondary | ICD-10-CM | POA: Diagnosis not present

## 2016-02-29 DIAGNOSIS — M4726 Other spondylosis with radiculopathy, lumbar region: Secondary | ICD-10-CM | POA: Diagnosis not present

## 2016-02-29 DIAGNOSIS — Z981 Arthrodesis status: Secondary | ICD-10-CM | POA: Diagnosis not present

## 2016-02-29 DIAGNOSIS — K219 Gastro-esophageal reflux disease without esophagitis: Secondary | ICD-10-CM | POA: Diagnosis not present

## 2016-02-29 DIAGNOSIS — Z79899 Other long term (current) drug therapy: Secondary | ICD-10-CM | POA: Diagnosis not present

## 2016-02-29 DIAGNOSIS — I4891 Unspecified atrial fibrillation: Secondary | ICD-10-CM | POA: Diagnosis not present

## 2016-02-29 DIAGNOSIS — M48062 Spinal stenosis, lumbar region with neurogenic claudication: Secondary | ICD-10-CM | POA: Diagnosis not present

## 2016-02-29 DIAGNOSIS — E785 Hyperlipidemia, unspecified: Secondary | ICD-10-CM | POA: Diagnosis not present

## 2016-02-29 DIAGNOSIS — Z7982 Long term (current) use of aspirin: Secondary | ICD-10-CM | POA: Diagnosis not present

## 2016-02-29 DIAGNOSIS — I119 Hypertensive heart disease without heart failure: Secondary | ICD-10-CM | POA: Diagnosis not present

## 2016-02-29 DIAGNOSIS — M48061 Spinal stenosis, lumbar region without neurogenic claudication: Secondary | ICD-10-CM | POA: Diagnosis not present

## 2016-03-01 DIAGNOSIS — Z86010 Personal history of colonic polyps: Secondary | ICD-10-CM | POA: Diagnosis not present

## 2016-03-01 DIAGNOSIS — Z981 Arthrodesis status: Secondary | ICD-10-CM | POA: Diagnosis not present

## 2016-03-01 DIAGNOSIS — I119 Hypertensive heart disease without heart failure: Secondary | ICD-10-CM | POA: Diagnosis not present

## 2016-03-01 DIAGNOSIS — K219 Gastro-esophageal reflux disease without esophagitis: Secondary | ICD-10-CM | POA: Diagnosis not present

## 2016-03-01 DIAGNOSIS — I4891 Unspecified atrial fibrillation: Secondary | ICD-10-CM | POA: Diagnosis not present

## 2016-03-01 DIAGNOSIS — M48062 Spinal stenosis, lumbar region with neurogenic claudication: Secondary | ICD-10-CM | POA: Diagnosis not present

## 2016-03-01 DIAGNOSIS — E785 Hyperlipidemia, unspecified: Secondary | ICD-10-CM | POA: Diagnosis not present

## 2016-03-01 DIAGNOSIS — M4316 Spondylolisthesis, lumbar region: Secondary | ICD-10-CM | POA: Diagnosis not present

## 2016-03-01 DIAGNOSIS — Z7982 Long term (current) use of aspirin: Secondary | ICD-10-CM | POA: Diagnosis not present

## 2016-03-01 DIAGNOSIS — Z79899 Other long term (current) drug therapy: Secondary | ICD-10-CM | POA: Diagnosis not present

## 2016-03-27 DIAGNOSIS — M4316 Spondylolisthesis, lumbar region: Secondary | ICD-10-CM | POA: Diagnosis not present

## 2016-03-27 DIAGNOSIS — M5416 Radiculopathy, lumbar region: Secondary | ICD-10-CM | POA: Diagnosis not present

## 2016-03-30 DIAGNOSIS — J069 Acute upper respiratory infection, unspecified: Secondary | ICD-10-CM | POA: Diagnosis not present

## 2016-03-30 DIAGNOSIS — E7800 Pure hypercholesterolemia, unspecified: Secondary | ICD-10-CM | POA: Diagnosis not present

## 2016-03-30 DIAGNOSIS — I10 Essential (primary) hypertension: Secondary | ICD-10-CM | POA: Diagnosis not present

## 2016-03-30 DIAGNOSIS — Z23 Encounter for immunization: Secondary | ICD-10-CM | POA: Diagnosis not present

## 2016-05-26 DIAGNOSIS — H524 Presbyopia: Secondary | ICD-10-CM | POA: Diagnosis not present

## 2016-06-01 DIAGNOSIS — Z0100 Encounter for examination of eyes and vision without abnormal findings: Secondary | ICD-10-CM | POA: Diagnosis not present

## 2016-06-28 DIAGNOSIS — M5416 Radiculopathy, lumbar region: Secondary | ICD-10-CM | POA: Diagnosis not present

## 2016-06-28 DIAGNOSIS — M4316 Spondylolisthesis, lumbar region: Secondary | ICD-10-CM | POA: Diagnosis not present

## 2016-06-28 DIAGNOSIS — M48062 Spinal stenosis, lumbar region with neurogenic claudication: Secondary | ICD-10-CM | POA: Diagnosis not present

## 2016-06-28 DIAGNOSIS — M4726 Other spondylosis with radiculopathy, lumbar region: Secondary | ICD-10-CM | POA: Diagnosis not present

## 2016-06-30 DIAGNOSIS — Z1211 Encounter for screening for malignant neoplasm of colon: Secondary | ICD-10-CM | POA: Diagnosis not present

## 2016-06-30 DIAGNOSIS — Z85048 Personal history of other malignant neoplasm of rectum, rectosigmoid junction, and anus: Secondary | ICD-10-CM | POA: Diagnosis not present

## 2016-06-30 DIAGNOSIS — Z86010 Personal history of colonic polyps: Secondary | ICD-10-CM | POA: Diagnosis not present

## 2016-07-06 DIAGNOSIS — E7800 Pure hypercholesterolemia, unspecified: Secondary | ICD-10-CM | POA: Diagnosis not present

## 2016-07-06 DIAGNOSIS — Z79899 Other long term (current) drug therapy: Secondary | ICD-10-CM | POA: Diagnosis not present

## 2016-07-06 DIAGNOSIS — I10 Essential (primary) hypertension: Secondary | ICD-10-CM | POA: Diagnosis not present

## 2016-07-11 DIAGNOSIS — R079 Chest pain, unspecified: Secondary | ICD-10-CM | POA: Diagnosis not present

## 2016-07-11 DIAGNOSIS — R002 Palpitations: Secondary | ICD-10-CM | POA: Diagnosis not present

## 2016-07-11 DIAGNOSIS — Z7901 Long term (current) use of anticoagulants: Secondary | ICD-10-CM | POA: Diagnosis not present

## 2016-07-12 DIAGNOSIS — Z1211 Encounter for screening for malignant neoplasm of colon: Secondary | ICD-10-CM | POA: Diagnosis not present

## 2016-07-12 DIAGNOSIS — D122 Benign neoplasm of ascending colon: Secondary | ICD-10-CM | POA: Diagnosis not present

## 2016-07-12 DIAGNOSIS — K635 Polyp of colon: Secondary | ICD-10-CM | POA: Diagnosis not present

## 2016-07-12 DIAGNOSIS — Z86010 Personal history of colonic polyps: Secondary | ICD-10-CM | POA: Diagnosis not present

## 2016-07-12 DIAGNOSIS — Z85048 Personal history of other malignant neoplasm of rectum, rectosigmoid junction, and anus: Secondary | ICD-10-CM | POA: Diagnosis not present

## 2016-07-12 DIAGNOSIS — D120 Benign neoplasm of cecum: Secondary | ICD-10-CM | POA: Diagnosis not present

## 2016-07-12 DIAGNOSIS — K5730 Diverticulosis of large intestine without perforation or abscess without bleeding: Secondary | ICD-10-CM | POA: Diagnosis not present

## 2016-07-12 HISTORY — PX: COLONOSCOPY: SHX174

## 2016-09-26 DIAGNOSIS — M5416 Radiculopathy, lumbar region: Secondary | ICD-10-CM | POA: Diagnosis not present

## 2016-09-26 DIAGNOSIS — M25561 Pain in right knee: Secondary | ICD-10-CM | POA: Diagnosis not present

## 2016-09-26 DIAGNOSIS — M4316 Spondylolisthesis, lumbar region: Secondary | ICD-10-CM | POA: Diagnosis not present

## 2016-09-26 DIAGNOSIS — M4726 Other spondylosis with radiculopathy, lumbar region: Secondary | ICD-10-CM | POA: Diagnosis not present

## 2016-09-26 DIAGNOSIS — M48062 Spinal stenosis, lumbar region with neurogenic claudication: Secondary | ICD-10-CM | POA: Diagnosis not present

## 2016-10-24 DIAGNOSIS — M4726 Other spondylosis with radiculopathy, lumbar region: Secondary | ICD-10-CM | POA: Diagnosis not present

## 2016-10-24 DIAGNOSIS — M5416 Radiculopathy, lumbar region: Secondary | ICD-10-CM | POA: Diagnosis not present

## 2016-10-24 DIAGNOSIS — M4316 Spondylolisthesis, lumbar region: Secondary | ICD-10-CM | POA: Diagnosis not present

## 2016-10-24 DIAGNOSIS — M48062 Spinal stenosis, lumbar region with neurogenic claudication: Secondary | ICD-10-CM | POA: Diagnosis not present

## 2016-10-27 DIAGNOSIS — E7800 Pure hypercholesterolemia, unspecified: Secondary | ICD-10-CM | POA: Diagnosis not present

## 2016-10-27 DIAGNOSIS — I10 Essential (primary) hypertension: Secondary | ICD-10-CM | POA: Diagnosis not present

## 2016-12-25 DIAGNOSIS — S46211A Strain of muscle, fascia and tendon of other parts of biceps, right arm, initial encounter: Secondary | ICD-10-CM | POA: Diagnosis not present

## 2016-12-27 DIAGNOSIS — R69 Illness, unspecified: Secondary | ICD-10-CM | POA: Diagnosis not present

## 2017-01-29 DIAGNOSIS — S59911A Unspecified injury of right forearm, initial encounter: Secondary | ICD-10-CM | POA: Diagnosis not present

## 2017-01-29 DIAGNOSIS — M79641 Pain in right hand: Secondary | ICD-10-CM | POA: Diagnosis not present

## 2017-01-29 DIAGNOSIS — M25541 Pain in joints of right hand: Secondary | ICD-10-CM | POA: Diagnosis not present

## 2017-01-29 DIAGNOSIS — M79621 Pain in right upper arm: Secondary | ICD-10-CM | POA: Diagnosis not present

## 2017-02-07 DIAGNOSIS — I10 Essential (primary) hypertension: Secondary | ICD-10-CM | POA: Diagnosis not present

## 2017-02-07 DIAGNOSIS — F320 Major depressive disorder, single episode, mild: Secondary | ICD-10-CM | POA: Diagnosis not present

## 2017-02-07 DIAGNOSIS — R5383 Other fatigue: Secondary | ICD-10-CM | POA: Diagnosis not present

## 2017-02-07 DIAGNOSIS — E669 Obesity, unspecified: Secondary | ICD-10-CM | POA: Diagnosis not present

## 2017-02-07 DIAGNOSIS — Z0000 Encounter for general adult medical examination without abnormal findings: Secondary | ICD-10-CM | POA: Diagnosis not present

## 2017-02-07 DIAGNOSIS — E7800 Pure hypercholesterolemia, unspecified: Secondary | ICD-10-CM | POA: Diagnosis not present

## 2017-02-07 DIAGNOSIS — Z23 Encounter for immunization: Secondary | ICD-10-CM | POA: Diagnosis not present

## 2017-02-07 DIAGNOSIS — Z6835 Body mass index (BMI) 35.0-35.9, adult: Secondary | ICD-10-CM | POA: Diagnosis not present

## 2017-02-07 DIAGNOSIS — Z79899 Other long term (current) drug therapy: Secondary | ICD-10-CM | POA: Diagnosis not present

## 2017-02-14 DIAGNOSIS — M5416 Radiculopathy, lumbar region: Secondary | ICD-10-CM | POA: Diagnosis not present

## 2017-02-14 DIAGNOSIS — M4316 Spondylolisthesis, lumbar region: Secondary | ICD-10-CM | POA: Diagnosis not present

## 2017-02-14 DIAGNOSIS — M25561 Pain in right knee: Secondary | ICD-10-CM | POA: Diagnosis not present

## 2017-02-14 DIAGNOSIS — M545 Low back pain: Secondary | ICD-10-CM | POA: Diagnosis not present

## 2017-02-22 DIAGNOSIS — Z1231 Encounter for screening mammogram for malignant neoplasm of breast: Secondary | ICD-10-CM | POA: Diagnosis not present

## 2017-04-11 DIAGNOSIS — I10 Essential (primary) hypertension: Secondary | ICD-10-CM | POA: Diagnosis not present

## 2017-04-11 DIAGNOSIS — Z79899 Other long term (current) drug therapy: Secondary | ICD-10-CM | POA: Diagnosis not present

## 2017-04-11 DIAGNOSIS — R5383 Other fatigue: Secondary | ICD-10-CM | POA: Diagnosis not present

## 2017-08-02 DIAGNOSIS — M1711 Unilateral primary osteoarthritis, right knee: Secondary | ICD-10-CM | POA: Diagnosis not present

## 2017-08-08 ENCOUNTER — Encounter: Payer: Self-pay | Admitting: Gastroenterology

## 2017-08-09 DIAGNOSIS — Z79899 Other long term (current) drug therapy: Secondary | ICD-10-CM | POA: Diagnosis not present

## 2017-08-09 DIAGNOSIS — I10 Essential (primary) hypertension: Secondary | ICD-10-CM | POA: Diagnosis not present

## 2017-08-09 DIAGNOSIS — E7800 Pure hypercholesterolemia, unspecified: Secondary | ICD-10-CM | POA: Diagnosis not present

## 2017-08-13 DIAGNOSIS — Z79899 Other long term (current) drug therapy: Secondary | ICD-10-CM | POA: Diagnosis not present

## 2017-08-13 DIAGNOSIS — I10 Essential (primary) hypertension: Secondary | ICD-10-CM | POA: Diagnosis not present

## 2017-08-13 DIAGNOSIS — E7800 Pure hypercholesterolemia, unspecified: Secondary | ICD-10-CM | POA: Diagnosis not present

## 2017-10-01 DIAGNOSIS — N3001 Acute cystitis with hematuria: Secondary | ICD-10-CM | POA: Diagnosis not present

## 2017-10-01 DIAGNOSIS — N10 Acute tubulo-interstitial nephritis: Secondary | ICD-10-CM | POA: Diagnosis not present

## 2017-10-01 DIAGNOSIS — R112 Nausea with vomiting, unspecified: Secondary | ICD-10-CM | POA: Diagnosis not present

## 2017-10-01 DIAGNOSIS — R1084 Generalized abdominal pain: Secondary | ICD-10-CM | POA: Diagnosis not present

## 2017-10-08 DIAGNOSIS — J069 Acute upper respiratory infection, unspecified: Secondary | ICD-10-CM | POA: Diagnosis not present

## 2017-10-08 DIAGNOSIS — M545 Low back pain: Secondary | ICD-10-CM | POA: Diagnosis not present

## 2017-10-08 DIAGNOSIS — R531 Weakness: Secondary | ICD-10-CM | POA: Diagnosis not present

## 2017-10-11 DIAGNOSIS — R1010 Upper abdominal pain, unspecified: Secondary | ICD-10-CM | POA: Diagnosis not present

## 2017-10-19 DIAGNOSIS — R109 Unspecified abdominal pain: Secondary | ICD-10-CM | POA: Diagnosis not present

## 2017-10-19 DIAGNOSIS — K449 Diaphragmatic hernia without obstruction or gangrene: Secondary | ICD-10-CM | POA: Diagnosis not present

## 2017-12-20 DIAGNOSIS — M1711 Unilateral primary osteoarthritis, right knee: Secondary | ICD-10-CM | POA: Diagnosis not present

## 2018-02-07 DIAGNOSIS — I4891 Unspecified atrial fibrillation: Secondary | ICD-10-CM | POA: Diagnosis not present

## 2018-02-07 DIAGNOSIS — Z79899 Other long term (current) drug therapy: Secondary | ICD-10-CM | POA: Diagnosis not present

## 2018-02-07 DIAGNOSIS — Z23 Encounter for immunization: Secondary | ICD-10-CM | POA: Diagnosis not present

## 2018-02-07 DIAGNOSIS — E7800 Pure hypercholesterolemia, unspecified: Secondary | ICD-10-CM | POA: Diagnosis not present

## 2018-02-07 DIAGNOSIS — Z6836 Body mass index (BMI) 36.0-36.9, adult: Secondary | ICD-10-CM | POA: Diagnosis not present

## 2018-02-07 DIAGNOSIS — Z0000 Encounter for general adult medical examination without abnormal findings: Secondary | ICD-10-CM | POA: Diagnosis not present

## 2018-02-07 DIAGNOSIS — I10 Essential (primary) hypertension: Secondary | ICD-10-CM | POA: Diagnosis not present

## 2018-02-07 DIAGNOSIS — F341 Dysthymic disorder: Secondary | ICD-10-CM | POA: Diagnosis not present

## 2018-03-08 DIAGNOSIS — Z1231 Encounter for screening mammogram for malignant neoplasm of breast: Secondary | ICD-10-CM | POA: Diagnosis not present

## 2018-03-15 DIAGNOSIS — J0100 Acute maxillary sinusitis, unspecified: Secondary | ICD-10-CM | POA: Diagnosis not present

## 2018-05-31 DIAGNOSIS — M1711 Unilateral primary osteoarthritis, right knee: Secondary | ICD-10-CM | POA: Diagnosis not present

## 2018-06-03 DIAGNOSIS — Z01810 Encounter for preprocedural cardiovascular examination: Secondary | ICD-10-CM | POA: Insufficient documentation

## 2018-06-03 HISTORY — DX: Encounter for preprocedural cardiovascular examination: Z01.810

## 2018-06-03 NOTE — Progress Notes (Addendum)
Cardiology Office Note:    Date:  06/04/2018   ID:  Caroline Osborne, DOB July 16, 1945, MRN [id number]  PCP:  Caroline Right, MD  Cardiologist:  Caroline More, MD   Referring MD: Caroline Graff, DO    and BMP and proBNP levels are normal.  Her revised cardiac risk score remains low class I.  Proceed with the planned surgical intervention  ASSESSMENT:    1. Preoperative cardiovascular examination   2. Atrial fibrillation, unspecified type (Caroline Osborne)   3. Hypertensive heart disease without heart failure   4. Hyperlipidemia, unspecified hyperlipidemia type    PLAN:    In order of problems listed above:  1. My opinion she is optimized for planned surgical procedure although I did draw a d-dimer level I will put in addendum to the note if significantly elevated would require preoperative echocardiogram and I doubt it is necessary she will stop aspirin 7 days prior to surgery please continue her usual antihypertensive lipid-lowering medications check an EKG postoperative day 1.  Please tell the patient when resume aspirin postoperatively 2. Stable paroxysmal in sinus rhythm no recurrence, continue her beta-blocker perioperatively at this time would not raise the issue of anticoagulation without clinical recurrence 3. Stable blood pressure target continue usual treatment I drew a BMP today to look at potassium and renal function preoperatively 4. Stable continue her statin.  Next appointment 1 year   Medication Adjustments/Labs and Tests Ordered: Current medicines are reviewed at length with the patient today.  Concerns regarding medicines are outlined above.  Orders Placed This Encounter  Procedures  . Basic Metabolic Panel (BMET)  . Pro b natriuretic peptide (BNP)  . EKG 12-Lead   No orders of the defined types were placed in this encounter.    Chief Complaint  Patient presents with  . Pre-op Exam    hsitory of PAF and hypertension    History of Present Illness:    Caroline Osborne is a 73 y.o. female who is being seen today for preoperative cardiology evaluation at the request of Hubler, Kyle, DO. She was seen previously at [facility] last 01/06/16 with PAF hypertension and hyperlipidemia.  Revised cardiac risk score=0, Class 1  She anticipates Osborne total knee arthroplasty 06/12/2018.  She has a history of paroxysmal atrial fibrillation has had no clinical recurrence and was not anticoagulated because of remote GI bleeding.  She has some palpitation its infrequent minor and not sustained not severe and she is in sinus rhythm today.  The planned procedure is not high risk.  The patient has no one recognized or decompensated cardiology issues and requires no further preoperative cardiac evaluation.  With her age greater than 32 asked to have a proBNP level done and if it severely elevated she would require further evaluation like echocardiogram.  I have asked her to stop her aspirin 5 to 7 days prior to the planned procedure please tell her and she can resume it continue her usual antihypertensive and lipid-lowering medications.  Note that her exercise tolerance remains well-preserved 5-7 METS or greater and she climbs a flight of stairs when she is at church with the children's program without having to stop because of shortness of breath.  She has had no edema orthopnea chest pain syncope or TIA.  She has no history of venous thromboembolism or resistant bacterial infection.  Past Medical History:  Diagnosis Date  . Arrhythmia   . GERD (gastroesophageal reflux disease)   . Hyperlipidemia   . Hypertension   .  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)     Past Surgical History:  Procedure Laterality Date  . BACK SURGERY    . CHOLECYSTECTOMY    . COLON RESECTION    . EYE SURGERY    . PARTIAL KNEE ARTHROPLASTY Left   . TUBAL LIGATION      Current Medications: Current Meds  Medication Sig  . aspirin EC 81 MG tablet Take 81 mg by mouth daily.  . Cholecalciferol (VITAMIN D-3 PO) Take 400  Units by mouth daily.  Marland Kitchen lisinopril-hydrochlorothiazide (PRINZIDE,ZESTORETIC) 20-12.5 MG tablet Take 0.5 tablets by mouth daily.  . metoprolol succinate (TOPROL-XL) 25 MG 24 hr tablet Take 0.5 tablets by mouth daily.  . Multiple Vitamin (MULTIVITAMIN) tablet Take 1 tablet by mouth daily.  . Omega 3 1000 MG CAPS Take 1 capsule by mouth daily.     Allergies:   Patient has no known allergies.   Social History   Socioeconomic History  . Marital status: Unknown    Spouse name: Not on file  . Number of children: Not on file  . Years of education: Not on file  . Highest education level: Not on file  Occupational History  . Not on file  Social Needs  . Financial resource strain: Not on file  . Food insecurity:    Worry: Not on file    Inability: Not on file  . Transportation needs:    Medical: Not on file    Non-medical: Not on file  Tobacco Use  . Smoking status: Never Smoker  . Smokeless tobacco: Never Used  Substance and Sexual Activity  . Alcohol use: Never    Frequency: Never  . Drug use: Never  . Sexual activity: Not on file  Lifestyle  . Physical activity:    Days per week: Not on file    Minutes per session: Not on file  . Stress: Not on file  Relationships  . Social connections:    Talks on phone: Not on file    Gets together: Not on file    Attends religious service: Not on file    Active member of club or organization: Not on file    Attends meetings of clubs or organizations: Not on file    Relationship status: Not on file  Other Topics Concern  . Not on file  Social History Narrative  . Not on file     Family History: The patient's family history includes Diabetes in her father; Hypertension in her brother and mother.  ROS:   Review of Systems  Constitution: Negative.  HENT: Negative.   Eyes: Negative.   Cardiovascular: Positive for palpitations (infrequent brief momentary).  Respiratory: Negative.   Endocrine: Negative.   Hematologic/Lymphatic:  Negative.   Skin: Negative.   Musculoskeletal: Positive for joint pain.  Gastrointestinal: Negative.   Genitourinary: Negative.   Neurological: Negative.   Psychiatric/Behavioral: Negative.   Allergic/Immunologic: Negative.    Please see the history of present illness.     All other systems reviewed and are negative.  EKGs/Labs/Other Studies Reviewed:    The following studies were reviewed today:   EKG:  EKG is  ordered today.  The ekg ordered today demonstrates [facility] and normal  Recent Labs: No results found for requested labs within last 8760 hours.  Recent Lipid Panel No results found for: CHOL, TRIG, HDL, CHOLHDL, VLDL, LDLCALC, LDLDIRECT  Physical Exam:    VS:  BP 114/76 (BP Location: Osborne Arm, Patient Position: Sitting, Cuff Size: Large)   Pulse 80  Ht 5\' 5"  (1.651 m)   Wt 206 lb 6.4 oz (93.6 kg)   SpO2 98%   BMI 34.35 kg/m     Wt Readings from Last 3 Encounters:  06/04/18 206 lb 6.4 oz (93.6 kg)     GEN:  Well nourished, well developed in no acute distress HEENT: Normal NECK: No JVD; No carotid bruits LYMPHATICS: No lymphadenopathy CARDIAC: RRR, no murmurs, rubs, gallops RESPIRATORY:  Clear to auscultation without rales, wheezing or rhonchi  ABDOMEN: Soft, non-tender, non-distended MUSCULOSKELETAL:  No edema; No deformity  SKIN: Warm and dry NEUROLOGIC:  Alert and oriented x 3 PSYCHIATRIC:  Normal affect     Signed, Caroline More, MD  06/04/2018 10:17 AM    [place]

## 2018-06-04 ENCOUNTER — Encounter: Payer: Self-pay | Admitting: Cardiology

## 2018-06-04 ENCOUNTER — Ambulatory Visit (INDEPENDENT_AMBULATORY_CARE_PROVIDER_SITE_OTHER): Payer: Medicare HMO | Admitting: Cardiology

## 2018-06-04 DIAGNOSIS — Z01810 Encounter for preprocedural cardiovascular examination: Secondary | ICD-10-CM | POA: Diagnosis not present

## 2018-06-04 DIAGNOSIS — E785 Hyperlipidemia, unspecified: Secondary | ICD-10-CM

## 2018-06-04 DIAGNOSIS — I119 Hypertensive heart disease without heart failure: Secondary | ICD-10-CM

## 2018-06-04 DIAGNOSIS — I4891 Unspecified atrial fibrillation: Secondary | ICD-10-CM

## 2018-06-04 HISTORY — DX: Unspecified atrial fibrillation: I48.91

## 2018-06-04 HISTORY — DX: Hypertensive heart disease without heart failure: I11.9

## 2018-06-04 LAB — BASIC METABOLIC PANEL
BUN/Creatinine Ratio: 12 (ref 12–28)
BUN: 9 mg/dL (ref 8–27)
CO2: 25 mmol/L (ref 20–29)
Calcium: 10 mg/dL (ref 8.7–10.3)
Chloride: 98 mmol/L (ref 96–106)
Creatinine, Ser: 0.74 mg/dL (ref 0.57–1.00)
GFR calc Af Amer: 92 mL/min/{1.73_m2} (ref >59)
GFR calc non Af Amer: 80 mL/min/{1.73_m2} (ref >59)
Glucose: 92 mg/dL (ref 65–99)
Potassium: 4.4 mmol/L (ref 3.5–5.2)
Sodium: 139 mmol/L (ref 134–144)

## 2018-06-04 LAB — PRO B NATRIURETIC PEPTIDE: NT-Pro BNP: 59 pg/mL (ref 0–301)

## 2018-06-04 NOTE — Patient Instructions (Signed)
Medication Instructions:  Your physician recommends that you continue on your current medications as directed. Please refer to the Current Medication list given to you today.  **STOP aspirin 1 week before surgery!**  If you need a refill on your cardiac medications before your next appointment, please call your pharmacy.   Lab work: Your physician recommends that you return for lab work today : BMP, ProBNP.  If you have labs (blood work) drawn today and your tests are completely normal, you will receive your results only by: Marland Kitchen MyChart Message (if you have MyChart) OR . A paper copy in the mail If you have any lab test that is abnormal or we need to change your treatment, we will call you to review the results.  Testing/Procedures: You had an EKG today.   Follow-Up: At [facility], you and your health needs are our priority.  As part of our continuing mission to provide you with exceptional heart care, we have created designated Provider Care Teams.  These Care Teams include your primary Cardiologist (physician) and Advanced Practice Providers (APPs -  Physician Assistants and Nurse Practitioners) who all work together to provide you with the care you need, when you need it. You will need a follow up appointment in 1 years.  Please call our office 2 months in advance to schedule this appointment.

## 2018-06-10 DIAGNOSIS — Z01818 Encounter for other preprocedural examination: Secondary | ICD-10-CM | POA: Diagnosis not present

## 2018-06-10 DIAGNOSIS — E7800 Pure hypercholesterolemia, unspecified: Secondary | ICD-10-CM | POA: Diagnosis not present

## 2018-06-10 DIAGNOSIS — I10 Essential (primary) hypertension: Secondary | ICD-10-CM | POA: Diagnosis not present

## 2018-06-10 DIAGNOSIS — M25561 Pain in right knee: Secondary | ICD-10-CM | POA: Diagnosis not present

## 2018-06-12 DIAGNOSIS — Z79899 Other long term (current) drug therapy: Secondary | ICD-10-CM | POA: Diagnosis not present

## 2018-06-12 DIAGNOSIS — R52 Pain, unspecified: Secondary | ICD-10-CM | POA: Diagnosis not present

## 2018-06-12 DIAGNOSIS — Z01811 Encounter for preprocedural respiratory examination: Secondary | ICD-10-CM | POA: Diagnosis not present

## 2018-06-12 DIAGNOSIS — M79609 Pain in unspecified limb: Secondary | ICD-10-CM | POA: Diagnosis not present

## 2018-06-12 DIAGNOSIS — Z01818 Encounter for other preprocedural examination: Secondary | ICD-10-CM | POA: Diagnosis not present

## 2018-06-22 HISTORY — PX: REPLACEMENT TOTAL KNEE: SUR1224

## 2018-07-02 DIAGNOSIS — M1711 Unilateral primary osteoarthritis, right knee: Secondary | ICD-10-CM | POA: Diagnosis not present

## 2018-07-10 DIAGNOSIS — G8918 Other acute postprocedural pain: Secondary | ICD-10-CM | POA: Diagnosis not present

## 2018-07-10 DIAGNOSIS — I4891 Unspecified atrial fibrillation: Secondary | ICD-10-CM | POA: Diagnosis not present

## 2018-07-10 DIAGNOSIS — E785 Hyperlipidemia, unspecified: Secondary | ICD-10-CM | POA: Diagnosis not present

## 2018-07-10 DIAGNOSIS — E7800 Pure hypercholesterolemia, unspecified: Secondary | ICD-10-CM | POA: Diagnosis not present

## 2018-07-10 DIAGNOSIS — G4700 Insomnia, unspecified: Secondary | ICD-10-CM | POA: Diagnosis not present

## 2018-07-10 DIAGNOSIS — Z96651 Presence of right artificial knee joint: Secondary | ICD-10-CM | POA: Diagnosis not present

## 2018-07-10 DIAGNOSIS — Z96652 Presence of left artificial knee joint: Secondary | ICD-10-CM | POA: Diagnosis not present

## 2018-07-10 DIAGNOSIS — K219 Gastro-esophageal reflux disease without esophagitis: Secondary | ICD-10-CM | POA: Diagnosis not present

## 2018-07-10 DIAGNOSIS — I10 Essential (primary) hypertension: Secondary | ICD-10-CM | POA: Diagnosis not present

## 2018-07-10 DIAGNOSIS — Z471 Aftercare following joint replacement surgery: Secondary | ICD-10-CM | POA: Diagnosis not present

## 2018-07-10 DIAGNOSIS — Z7982 Long term (current) use of aspirin: Secondary | ICD-10-CM | POA: Diagnosis not present

## 2018-07-10 DIAGNOSIS — M1711 Unilateral primary osteoarthritis, right knee: Secondary | ICD-10-CM | POA: Diagnosis not present

## 2018-07-13 DIAGNOSIS — I480 Paroxysmal atrial fibrillation: Secondary | ICD-10-CM | POA: Diagnosis not present

## 2018-07-13 DIAGNOSIS — I119 Hypertensive heart disease without heart failure: Secondary | ICD-10-CM | POA: Diagnosis not present

## 2018-07-13 DIAGNOSIS — K219 Gastro-esophageal reflux disease without esophagitis: Secondary | ICD-10-CM | POA: Diagnosis not present

## 2018-07-13 DIAGNOSIS — Z96651 Presence of right artificial knee joint: Secondary | ICD-10-CM | POA: Diagnosis not present

## 2018-07-13 DIAGNOSIS — M1991 Primary osteoarthritis, unspecified site: Secondary | ICD-10-CM | POA: Diagnosis not present

## 2018-07-13 DIAGNOSIS — E785 Hyperlipidemia, unspecified: Secondary | ICD-10-CM | POA: Diagnosis not present

## 2018-07-13 DIAGNOSIS — Z7982 Long term (current) use of aspirin: Secondary | ICD-10-CM | POA: Diagnosis not present

## 2018-07-13 DIAGNOSIS — Z471 Aftercare following joint replacement surgery: Secondary | ICD-10-CM | POA: Diagnosis not present

## 2018-07-13 DIAGNOSIS — Z85038 Personal history of other malignant neoplasm of large intestine: Secondary | ICD-10-CM | POA: Diagnosis not present

## 2018-07-15 DIAGNOSIS — Z85038 Personal history of other malignant neoplasm of large intestine: Secondary | ICD-10-CM | POA: Diagnosis not present

## 2018-07-15 DIAGNOSIS — K219 Gastro-esophageal reflux disease without esophagitis: Secondary | ICD-10-CM | POA: Diagnosis not present

## 2018-07-15 DIAGNOSIS — M1991 Primary osteoarthritis, unspecified site: Secondary | ICD-10-CM | POA: Diagnosis not present

## 2018-07-15 DIAGNOSIS — Z96651 Presence of right artificial knee joint: Secondary | ICD-10-CM | POA: Diagnosis not present

## 2018-07-15 DIAGNOSIS — E785 Hyperlipidemia, unspecified: Secondary | ICD-10-CM | POA: Diagnosis not present

## 2018-07-15 DIAGNOSIS — Z7982 Long term (current) use of aspirin: Secondary | ICD-10-CM | POA: Diagnosis not present

## 2018-07-15 DIAGNOSIS — I119 Hypertensive heart disease without heart failure: Secondary | ICD-10-CM | POA: Diagnosis not present

## 2018-07-15 DIAGNOSIS — I480 Paroxysmal atrial fibrillation: Secondary | ICD-10-CM | POA: Diagnosis not present

## 2018-07-15 DIAGNOSIS — Z471 Aftercare following joint replacement surgery: Secondary | ICD-10-CM | POA: Diagnosis not present

## 2018-07-16 DIAGNOSIS — Z85038 Personal history of other malignant neoplasm of large intestine: Secondary | ICD-10-CM | POA: Diagnosis not present

## 2018-07-16 DIAGNOSIS — I119 Hypertensive heart disease without heart failure: Secondary | ICD-10-CM | POA: Diagnosis not present

## 2018-07-16 DIAGNOSIS — Z7982 Long term (current) use of aspirin: Secondary | ICD-10-CM | POA: Diagnosis not present

## 2018-07-16 DIAGNOSIS — Z471 Aftercare following joint replacement surgery: Secondary | ICD-10-CM | POA: Diagnosis not present

## 2018-07-16 DIAGNOSIS — Z96651 Presence of right artificial knee joint: Secondary | ICD-10-CM | POA: Diagnosis not present

## 2018-07-16 DIAGNOSIS — K219 Gastro-esophageal reflux disease without esophagitis: Secondary | ICD-10-CM | POA: Diagnosis not present

## 2018-07-16 DIAGNOSIS — M1991 Primary osteoarthritis, unspecified site: Secondary | ICD-10-CM | POA: Diagnosis not present

## 2018-07-16 DIAGNOSIS — E785 Hyperlipidemia, unspecified: Secondary | ICD-10-CM | POA: Diagnosis not present

## 2018-07-16 DIAGNOSIS — I480 Paroxysmal atrial fibrillation: Secondary | ICD-10-CM | POA: Diagnosis not present

## 2018-07-17 ENCOUNTER — Other Ambulatory Visit: Payer: Self-pay

## 2018-07-17 DIAGNOSIS — Z85038 Personal history of other malignant neoplasm of large intestine: Secondary | ICD-10-CM | POA: Diagnosis not present

## 2018-07-17 DIAGNOSIS — E785 Hyperlipidemia, unspecified: Secondary | ICD-10-CM | POA: Diagnosis not present

## 2018-07-17 DIAGNOSIS — Z7982 Long term (current) use of aspirin: Secondary | ICD-10-CM | POA: Diagnosis not present

## 2018-07-17 DIAGNOSIS — I480 Paroxysmal atrial fibrillation: Secondary | ICD-10-CM | POA: Diagnosis not present

## 2018-07-17 DIAGNOSIS — Z96651 Presence of right artificial knee joint: Secondary | ICD-10-CM | POA: Diagnosis not present

## 2018-07-17 DIAGNOSIS — I119 Hypertensive heart disease without heart failure: Secondary | ICD-10-CM | POA: Diagnosis not present

## 2018-07-17 DIAGNOSIS — M1991 Primary osteoarthritis, unspecified site: Secondary | ICD-10-CM | POA: Diagnosis not present

## 2018-07-17 DIAGNOSIS — K219 Gastro-esophageal reflux disease without esophagitis: Secondary | ICD-10-CM | POA: Diagnosis not present

## 2018-07-17 DIAGNOSIS — Z471 Aftercare following joint replacement surgery: Secondary | ICD-10-CM | POA: Diagnosis not present

## 2018-07-17 NOTE — Patient Outreach (Signed)
[facility]) Care Management  07/17/2018  Caroline Osborne March 11, 1946 [id number]  Transition of care  Referral date: 07/16/18 Referral source: discharged from [facility] on 07/12/18 Insurance: [employer]  Telephone call to patient regarding transition of care referral. HPAA verified with patient. Explained reason for call.  Patient states she was in the hospital for a right total knee replacement.  Patient states she has a follow up appointment with the orthopedic surgeon on 07/23/18.  Patient states she does not have a follow up with her primary MD. RNCM advised patient to call her primary MD office to notify them of her discharge from the hospital. Patient. Verbalized understanding.  Patient states she is receiving services with [facility] home health for physical therapy.   Patient states the physical therapist removed the dressing from her knee on today and applied a fresh dressing. RNCM discussed signs/ symptoms of infection. Patient denies symptoms of infection.  Patient states her sister will be transporting her to her doctor appointment.  Patient states she is taking her medication as prescribed. Patient denies any new symptoms or concerns at this time.  RNCM advised patient to notify MD of any changes in condition prior to scheduled appointment. RNCM provided contact name and number:24 hour nurse advise line [phone].  RNCM verified patient aware of 911 services for urgent/ emergent needs.  PLAN:  RNCm will close patient due to patient being assessed and having no further needs. RNCM will send patients primary MD closure notification  RNCM will send patient [facility] brochure/ magnet as discussed.  Quinn Plowman RN,BSN,CCM [facility] Telephonic  [phone]

## 2018-07-18 DIAGNOSIS — I119 Hypertensive heart disease without heart failure: Secondary | ICD-10-CM | POA: Diagnosis not present

## 2018-07-18 DIAGNOSIS — Z7982 Long term (current) use of aspirin: Secondary | ICD-10-CM | POA: Diagnosis not present

## 2018-07-18 DIAGNOSIS — Z85038 Personal history of other malignant neoplasm of large intestine: Secondary | ICD-10-CM | POA: Diagnosis not present

## 2018-07-18 DIAGNOSIS — M1991 Primary osteoarthritis, unspecified site: Secondary | ICD-10-CM | POA: Diagnosis not present

## 2018-07-18 DIAGNOSIS — K219 Gastro-esophageal reflux disease without esophagitis: Secondary | ICD-10-CM | POA: Diagnosis not present

## 2018-07-18 DIAGNOSIS — Z471 Aftercare following joint replacement surgery: Secondary | ICD-10-CM | POA: Diagnosis not present

## 2018-07-18 DIAGNOSIS — E785 Hyperlipidemia, unspecified: Secondary | ICD-10-CM | POA: Diagnosis not present

## 2018-07-18 DIAGNOSIS — I480 Paroxysmal atrial fibrillation: Secondary | ICD-10-CM | POA: Diagnosis not present

## 2018-07-18 DIAGNOSIS — Z96651 Presence of right artificial knee joint: Secondary | ICD-10-CM | POA: Diagnosis not present

## 2018-07-19 DIAGNOSIS — M1991 Primary osteoarthritis, unspecified site: Secondary | ICD-10-CM | POA: Diagnosis not present

## 2018-07-19 DIAGNOSIS — E785 Hyperlipidemia, unspecified: Secondary | ICD-10-CM | POA: Diagnosis not present

## 2018-07-19 DIAGNOSIS — Z471 Aftercare following joint replacement surgery: Secondary | ICD-10-CM | POA: Diagnosis not present

## 2018-07-19 DIAGNOSIS — I119 Hypertensive heart disease without heart failure: Secondary | ICD-10-CM | POA: Diagnosis not present

## 2018-07-19 DIAGNOSIS — Z96651 Presence of right artificial knee joint: Secondary | ICD-10-CM | POA: Diagnosis not present

## 2018-07-19 DIAGNOSIS — Z7982 Long term (current) use of aspirin: Secondary | ICD-10-CM | POA: Diagnosis not present

## 2018-07-19 DIAGNOSIS — K219 Gastro-esophageal reflux disease without esophagitis: Secondary | ICD-10-CM | POA: Diagnosis not present

## 2018-07-19 DIAGNOSIS — I480 Paroxysmal atrial fibrillation: Secondary | ICD-10-CM | POA: Diagnosis not present

## 2018-07-19 DIAGNOSIS — Z85038 Personal history of other malignant neoplasm of large intestine: Secondary | ICD-10-CM | POA: Diagnosis not present

## 2018-07-22 DIAGNOSIS — I480 Paroxysmal atrial fibrillation: Secondary | ICD-10-CM | POA: Diagnosis not present

## 2018-07-22 DIAGNOSIS — Z7982 Long term (current) use of aspirin: Secondary | ICD-10-CM | POA: Diagnosis not present

## 2018-07-22 DIAGNOSIS — K219 Gastro-esophageal reflux disease without esophagitis: Secondary | ICD-10-CM | POA: Diagnosis not present

## 2018-07-22 DIAGNOSIS — M1991 Primary osteoarthritis, unspecified site: Secondary | ICD-10-CM | POA: Diagnosis not present

## 2018-07-22 DIAGNOSIS — Z471 Aftercare following joint replacement surgery: Secondary | ICD-10-CM | POA: Diagnosis not present

## 2018-07-22 DIAGNOSIS — I119 Hypertensive heart disease without heart failure: Secondary | ICD-10-CM | POA: Diagnosis not present

## 2018-07-22 DIAGNOSIS — E785 Hyperlipidemia, unspecified: Secondary | ICD-10-CM | POA: Diagnosis not present

## 2018-07-22 DIAGNOSIS — Z85038 Personal history of other malignant neoplasm of large intestine: Secondary | ICD-10-CM | POA: Diagnosis not present

## 2018-07-22 DIAGNOSIS — Z96651 Presence of right artificial knee joint: Secondary | ICD-10-CM | POA: Diagnosis not present

## 2018-07-24 DIAGNOSIS — R2689 Other abnormalities of gait and mobility: Secondary | ICD-10-CM | POA: Diagnosis not present

## 2018-07-24 DIAGNOSIS — M25661 Stiffness of right knee, not elsewhere classified: Secondary | ICD-10-CM | POA: Diagnosis not present

## 2018-07-24 DIAGNOSIS — M25561 Pain in right knee: Secondary | ICD-10-CM | POA: Diagnosis not present

## 2018-07-30 DIAGNOSIS — R2689 Other abnormalities of gait and mobility: Secondary | ICD-10-CM | POA: Diagnosis not present

## 2018-07-30 DIAGNOSIS — M25561 Pain in right knee: Secondary | ICD-10-CM | POA: Diagnosis not present

## 2018-07-30 DIAGNOSIS — M25661 Stiffness of right knee, not elsewhere classified: Secondary | ICD-10-CM | POA: Diagnosis not present

## 2018-08-01 DIAGNOSIS — R2689 Other abnormalities of gait and mobility: Secondary | ICD-10-CM | POA: Diagnosis not present

## 2018-08-01 DIAGNOSIS — M25561 Pain in right knee: Secondary | ICD-10-CM | POA: Diagnosis not present

## 2018-08-01 DIAGNOSIS — M25661 Stiffness of right knee, not elsewhere classified: Secondary | ICD-10-CM | POA: Diagnosis not present

## 2018-08-06 DIAGNOSIS — M25661 Stiffness of right knee, not elsewhere classified: Secondary | ICD-10-CM | POA: Diagnosis not present

## 2018-08-06 DIAGNOSIS — M25561 Pain in right knee: Secondary | ICD-10-CM | POA: Diagnosis not present

## 2018-08-06 DIAGNOSIS — R2689 Other abnormalities of gait and mobility: Secondary | ICD-10-CM | POA: Diagnosis not present

## 2018-08-08 DIAGNOSIS — M25661 Stiffness of right knee, not elsewhere classified: Secondary | ICD-10-CM | POA: Diagnosis not present

## 2018-08-08 DIAGNOSIS — M25561 Pain in right knee: Secondary | ICD-10-CM | POA: Diagnosis not present

## 2018-08-08 DIAGNOSIS — R2689 Other abnormalities of gait and mobility: Secondary | ICD-10-CM | POA: Diagnosis not present

## 2018-08-13 DIAGNOSIS — R2689 Other abnormalities of gait and mobility: Secondary | ICD-10-CM | POA: Diagnosis not present

## 2018-08-13 DIAGNOSIS — M25561 Pain in right knee: Secondary | ICD-10-CM | POA: Diagnosis not present

## 2018-08-13 DIAGNOSIS — M25661 Stiffness of right knee, not elsewhere classified: Secondary | ICD-10-CM | POA: Diagnosis not present

## 2018-08-15 DIAGNOSIS — M25561 Pain in right knee: Secondary | ICD-10-CM | POA: Diagnosis not present

## 2018-08-15 DIAGNOSIS — R2689 Other abnormalities of gait and mobility: Secondary | ICD-10-CM | POA: Diagnosis not present

## 2018-08-15 DIAGNOSIS — M25661 Stiffness of right knee, not elsewhere classified: Secondary | ICD-10-CM | POA: Diagnosis not present

## 2018-08-20 DIAGNOSIS — M25561 Pain in right knee: Secondary | ICD-10-CM | POA: Diagnosis not present

## 2018-08-21 DIAGNOSIS — M25661 Stiffness of right knee, not elsewhere classified: Secondary | ICD-10-CM | POA: Diagnosis not present

## 2018-08-21 DIAGNOSIS — R2689 Other abnormalities of gait and mobility: Secondary | ICD-10-CM | POA: Diagnosis not present

## 2018-08-21 DIAGNOSIS — M25561 Pain in right knee: Secondary | ICD-10-CM | POA: Diagnosis not present

## 2018-08-28 DIAGNOSIS — M25661 Stiffness of right knee, not elsewhere classified: Secondary | ICD-10-CM | POA: Diagnosis not present

## 2018-08-28 DIAGNOSIS — R2689 Other abnormalities of gait and mobility: Secondary | ICD-10-CM | POA: Diagnosis not present

## 2018-08-28 DIAGNOSIS — M25561 Pain in right knee: Secondary | ICD-10-CM | POA: Diagnosis not present

## 2018-09-04 DIAGNOSIS — R2689 Other abnormalities of gait and mobility: Secondary | ICD-10-CM | POA: Diagnosis not present

## 2018-09-04 DIAGNOSIS — M25661 Stiffness of right knee, not elsewhere classified: Secondary | ICD-10-CM | POA: Diagnosis not present

## 2018-09-04 DIAGNOSIS — M25561 Pain in right knee: Secondary | ICD-10-CM | POA: Diagnosis not present

## 2018-09-11 DIAGNOSIS — M25561 Pain in right knee: Secondary | ICD-10-CM | POA: Diagnosis not present

## 2018-09-11 DIAGNOSIS — R2689 Other abnormalities of gait and mobility: Secondary | ICD-10-CM | POA: Diagnosis not present

## 2018-09-11 DIAGNOSIS — M25661 Stiffness of right knee, not elsewhere classified: Secondary | ICD-10-CM | POA: Diagnosis not present

## 2018-09-18 DIAGNOSIS — R2689 Other abnormalities of gait and mobility: Secondary | ICD-10-CM | POA: Diagnosis not present

## 2018-09-18 DIAGNOSIS — M25661 Stiffness of right knee, not elsewhere classified: Secondary | ICD-10-CM | POA: Diagnosis not present

## 2018-09-18 DIAGNOSIS — M25561 Pain in right knee: Secondary | ICD-10-CM | POA: Diagnosis not present

## 2018-09-25 DIAGNOSIS — R2689 Other abnormalities of gait and mobility: Secondary | ICD-10-CM | POA: Diagnosis not present

## 2018-09-25 DIAGNOSIS — M25561 Pain in right knee: Secondary | ICD-10-CM | POA: Diagnosis not present

## 2018-09-25 DIAGNOSIS — M25661 Stiffness of right knee, not elsewhere classified: Secondary | ICD-10-CM | POA: Diagnosis not present

## 2018-11-12 ENCOUNTER — Telehealth: Payer: Self-pay | Admitting: Gastroenterology

## 2018-11-12 NOTE — Telephone Encounter (Signed)
Pt reported that she is experiencing abdominal pain and blood in stool.  Please call back to advise.

## 2018-11-13 NOTE — Telephone Encounter (Signed)
Patient returned phone call. Best # [phone]

## 2018-11-13 NOTE — Telephone Encounter (Signed)
Left message on machine to call back pt has an appt on 7/10 with Dr Lyndel Safe

## 2018-11-14 NOTE — Telephone Encounter (Signed)
The pt has been scheduled for 7/10 with Dr Lyndel Safe.  She states she has abd pain and rectal pressure.  She was advised that if she does not think she can wait for that appt she will see her PCP.  I did look for sooner appt and nothing available.

## 2018-11-18 ENCOUNTER — Encounter: Payer: Self-pay | Admitting: Gastroenterology

## 2018-11-19 DIAGNOSIS — Z96651 Presence of right artificial knee joint: Secondary | ICD-10-CM | POA: Diagnosis not present

## 2018-11-29 ENCOUNTER — Other Ambulatory Visit: Payer: Self-pay

## 2018-11-29 ENCOUNTER — Encounter: Payer: Self-pay | Admitting: Gastroenterology

## 2018-11-29 ENCOUNTER — Telehealth (INDEPENDENT_AMBULATORY_CARE_PROVIDER_SITE_OTHER): Payer: Medicare HMO | Admitting: Gastroenterology

## 2018-11-29 VITALS — Ht 65.0 in | Wt 212.0 lb

## 2018-11-29 DIAGNOSIS — Z86007 Personal history of in-situ neoplasm of skin: Secondary | ICD-10-CM

## 2018-11-29 DIAGNOSIS — K6289 Other specified diseases of anus and rectum: Secondary | ICD-10-CM

## 2018-11-29 DIAGNOSIS — K625 Hemorrhage of anus and rectum: Secondary | ICD-10-CM

## 2018-11-29 DIAGNOSIS — Z86010 Personal history of colonic polyps: Secondary | ICD-10-CM

## 2018-11-29 MED ORDER — HYDROCORTISONE (PERIANAL) 2.5 % EX CREA: 1.0000 "application " | TOPICAL_CREAM | Freq: Two times a day (BID) | CUTANEOUS | 2 refills | Status: AC

## 2018-11-29 NOTE — Progress Notes (Signed)
Chief Complaint:   Referring Provider:  Greig Right, MD      ASSESSMENT AND PLAN;   #1.  Rectal bleeding with rectal pain, lower abdo pain, intermittent diarrhea.  #2.  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 Pauletta Browns. Bx-clear margins, HPV effect.  No need for chemo/XRT. Followed with serial FSs/colons. Last colon 06/2016 - neg.  #3.  H/O colonic polyps. Last colon 06/2016-fair prep, colonic polyps s/p polypectomy, mild sigmoid diverticulosis, rectal scar, internal hemorrhoids. Bx- TAs  Plan: - Check CBC, CMP, TSH. - Stool for GI path, WBC, culture. - CT scan ado/pel with PO and IV contrast here @68 . - HC 2.5 % BID PR x 10 days (2 refills). - If still with problems and the above WU is negative, proceed with colon with 2 day prep. - FU in 4 weeks for televisit.  Will need flex sig at FU visit, if not better. - D/w Heather.    HPI:    Caroline Osborne is a 73 y.o. female  S/P R knee replacement Feb 2020 Since then has been having problems with bowel movements. Initially got constipated Now intermittent diarrhea Associated with rectal bleeding intermittently more over the last 2 months.  Sometimes, mixed with the stool. Still would occasionally get constipated and would have change in stool caliber. Has been having more "flat" stools. Some associated lower abdominal pain mostly suprapubic and rectal, nonradiating, occasionally gets relieved by defecation. No weight loss  Has been off all narcotics/antibiotics or any blood thinners except baby aspirin.  Past Medical History:  Diagnosis Date  . Arrhythmia   . Arthritis   . Chronic back pain   . GERD (gastroesophageal reflux disease)   . Hyperlipidemia   . Hypertension   . Internal hemorrhoids   . 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 BACK SURGERY    . CHOLECYSTECTOMY    . COLON RESECTION    . COLONOSCOPY  07/12/2016   Colonic polyp  status post polypectomy. Mild sigmoid diverticulosis.   Marland Kitchen ESOPHAGOGASTRODUODENOSCOPY  01/21/2003   Small hiatal hernia. Mild gastroduodenitis. Small gastric polyps s/p polypectomy.   . EYE SURGERY    . PARTIAL KNEE ARTHROPLASTY Left   . 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 TUBAL LIGATION      Family History  Problem Relation Age of Onset  . Hypertension Mother   . Breast cancer Mother   . Diabetes Father   . Hypertension Brother   . Colon cancer Neg Hx     Social History   Tobacco Use  . Smoking status: Never Smoker  . Smokeless tobacco: Never Used  Substance Use Topics  . Alcohol use: Never    Frequency: Never  . Drug use: Never    Current Outpatient Medications  Medication Sig Dispense Refill  . aspirin EC 81 MG tablet Take 81 mg by mouth daily.    . Cholecalciferol (VITAMIN D-3 PO) Take 400 Units by mouth daily.    Marland Kitchen lisinopril-hydrochlorothiazide (PRINZIDE,ZESTORETIC) 20-12.5 MG tablet Take 0.5 tablets by mouth daily.    . metoprolol succinate (TOPROL-XL) 25 MG 24 hr tablet Take 0.5 tablets by mouth daily.    . Multiple Vitamin (MULTIVITAMIN) tablet Take 1 tablet by mouth daily.    . Omega 3 1000 MG CAPS Take 1 capsule by mouth daily.     No current facility-administered medications for this visit.     No Known Allergies  Review of Systems:  Constitutional: Denies fever, chills, diaphoresis, appetite change and fatigue.  neg     Physical Exam:    Ht 5\' 5"  (1.651 m)   Wt 212 lb (96.2 kg)   BMI 35.28 kg/m  Filed Weights   11/29/18 0937  Weight: 212 lb (96.2 kg)   televisit  Data Reviewed: I have personally reviewed following labs and imaging studies  CBC: No flowsheet data found.  CMP: CMP Latest Ref Rng & Units 06/04/2018  Glucose 65 - 99 mg/dL 92  BUN 8 - 27 mg/dL 9  Creatinine 0.57 - 1.00 mg/dL 0.74  Sodium 134 - 144 mmol/L 139  Potassium 3.5 - 5.2 mmol/L 4.4  Chloride 96 - 106 mmol/L 98  CO2 20 - 29 mmol/L 25  Calcium 8.7 - 10.3 mg/dL  10.0    This service was provided via telemedicine.  The patient was located at home.  The provider was located in office.  The patient did consent to this telephone visit and is aware of possible charges through their insurance for this visit.  The patient was referred by Dr. Burnett Sheng.    Time spent on call/coordination of care: 25 min    Carmell Austria, MD 11/29/2018, 10:09 AM  Cc: Greig Right, MD

## 2018-11-29 NOTE — Patient Instructions (Signed)
If you are age 75 or older, your body mass index should be between 23-30. Your Body mass index is 35.28 kg/m. If this is out of the aforementioned range listed, please consider follow up with your Primary Care Provider.  If you are age 24 or younger, your body mass index should be between 19-25. Your Body mass index is 35.28 kg/m. If this is out of the aformentioned range listed, please consider follow up with your Primary Care Provider.   To help prevent the possible spread of infection to our patients, communities, and staff; we will be implementing the following measures:  As of now we are not allowing any visitors/family members to accompany you to any upcoming appointments with [facility]. If you have any concerns about this please contact our office to discuss prior to the appointment.   We have sent the following medications to your pharmacy for you to pick up at your convenience: [facility] 2.5% twice daily for 10 days  You have been scheduled [facility] a CT [facility] the [facility] [place] [address] [address] [facility]).   You are scheduled on 12/06/2018 at 10:00am. You should arrive 15 minutes prior to your appointment time for registration. Please follow the written instructions below on the day of your exam:  WARNING: IF YOU ARE ALLERGIC TO IODINE/X-RAY DYE, PLEASE NOTIFY RADIOLOGY IMMEDIATELY AT [phone]! YOU WILL BE GIVEN A 13 HOUR PREMEDICATION PREP.  1) Do not eat or drink anything after 6:00am (4 hours prior to your test) 2) You have been given 2 bottles of oral contrast to drink. The solution may taste better if refrigerated, but do NOT add ice or any other liquid to this solution. Shake well before drinking.    Drink 1 bottle of contrast @ 8:00am (2 hours prior to your exam)  Drink 1 bottle of contrast @ 9:00am (1 hour prior to your exam)  You may take any medications as prescribed with a small amount of water, if  necessary. If you take any of the following medications: METFORMIN, GLUCOPHAGE, GLUCOVANCE, AVANDAMET, RIOMET, FORTAMET, [place] MET, JANUMET, GLUMETZA or METAGLIP, you MAY be asked to HOLD this medication 48 hours AFTER the exam.  The purpose of you drinking the oral contrast is to aid in the visualization of your intestinal tract. The contrast solution may cause some diarrhea. Depending on your individual set of symptoms, you may also receive an intravenous injection of x-ray contrast/dye. Plan on being at [facility] for 30 minutes or longer, depending on the type of exam you are having performed.  This test typically takes 30-45 minutes to complete.  If you have any questions regarding your exam or if you need to reschedule, you may call the CT department at [phone] between the hours of 8:00 am and 5:00 pm, Monday-Friday.  ________________________________________________________________________   Thank you,  Dr. Jackquline Denmark

## 2018-12-04 ENCOUNTER — Other Ambulatory Visit: Payer: Self-pay

## 2018-12-04 DIAGNOSIS — Z86007 Personal history of in-situ neoplasm of skin: Secondary | ICD-10-CM

## 2018-12-04 DIAGNOSIS — Z86010 Personal history of colonic polyps: Secondary | ICD-10-CM

## 2018-12-04 DIAGNOSIS — K625 Hemorrhage of anus and rectum: Secondary | ICD-10-CM

## 2018-12-04 DIAGNOSIS — K6289 Other specified diseases of anus and rectum: Secondary | ICD-10-CM | POA: Diagnosis not present

## 2018-12-05 DIAGNOSIS — K625 Hemorrhage of anus and rectum: Secondary | ICD-10-CM | POA: Diagnosis not present

## 2018-12-05 DIAGNOSIS — K6289 Other specified diseases of anus and rectum: Secondary | ICD-10-CM | POA: Diagnosis not present

## 2018-12-05 LAB — BASIC METABOLIC PANEL
BUN/Creatinine Ratio: 15 (ref 12–28)
BUN: 10 mg/dL (ref 8–27)
CO2: 24 mmol/L (ref 20–29)
Calcium: 9.9 mg/dL (ref 8.7–10.3)
Chloride: 100 mmol/L (ref 96–106)
Creatinine, Ser: 0.65 mg/dL (ref 0.57–1.00)
GFR calc Af Amer: 101 mL/min/{1.73_m2} (ref >59)
GFR calc non Af Amer: 88 mL/min/{1.73_m2} (ref >59)
Glucose: 100 mg/dL — ABNORMAL HIGH (ref 65–99)
Potassium: 3.9 mmol/L (ref 3.5–5.2)
Sodium: 142 mmol/L (ref 134–144)

## 2018-12-05 LAB — CBC WITH DIFFERENTIAL/PLATELET
Basophils Absolute: 0 10*3/uL (ref 0.0–0.2)
Basos: 0 %
EOS (ABSOLUTE): 0.1 10*3/uL (ref 0.0–0.4)
Eos: 2 %
Hematocrit: 42 % (ref 34.0–46.6)
Hemoglobin: 14 g/dL (ref 11.1–15.9)
Immature Grans (Abs): 0 10*3/uL (ref 0.0–0.1)
Immature Granulocytes: 0 %
Lymphocytes Absolute: 2.3 10*3/uL (ref 0.7–3.1)
Lymphs: 31 %
MCH: 29.1 pg (ref 26.6–33.0)
MCHC: 33.3 g/dL (ref 31.5–35.7)
MCV: 87 fL (ref 79–97)
Monocytes Absolute: 0.5 10*3/uL (ref 0.1–0.9)
Monocytes: 7 %
Neutrophils Absolute: 4.4 10*3/uL (ref 1.4–7.0)
Neutrophils: 60 %
Platelets: 250 10*3/uL (ref 150–450)
RBC: 4.81 x10E6/uL (ref 3.77–5.28)
RDW: 13.7 % (ref 11.7–15.4)
WBC: 7.5 10*3/uL (ref 3.4–10.8)

## 2018-12-05 LAB — TSH: TSH: 1.05 u[IU]/mL (ref 0.450–4.500)

## 2018-12-06 ENCOUNTER — Encounter (HOSPITAL_BASED_OUTPATIENT_CLINIC_OR_DEPARTMENT_OTHER): Payer: Self-pay

## 2018-12-06 ENCOUNTER — Ambulatory Visit (HOSPITAL_BASED_OUTPATIENT_CLINIC_OR_DEPARTMENT_OTHER)
Admission: RE | Admit: 2018-12-06 | Discharge: 2018-12-06 | Disposition: A | Discharge status: Home / Self Care | Payer: Medicare HMO | Source: Ambulatory Visit | Attending: Gastroenterology | Admitting: Gastroenterology

## 2018-12-06 ENCOUNTER — Other Ambulatory Visit: Payer: Self-pay

## 2018-12-06 DIAGNOSIS — K6289 Other specified diseases of anus and rectum: Secondary | ICD-10-CM | POA: Insufficient documentation

## 2018-12-06 DIAGNOSIS — K625 Hemorrhage of anus and rectum: Secondary | ICD-10-CM | POA: Insufficient documentation

## 2018-12-06 DIAGNOSIS — Z86007 Personal history of in-situ neoplasm of skin: Secondary | ICD-10-CM | POA: Diagnosis not present

## 2018-12-06 DIAGNOSIS — Z9049 Acquired absence of other specified parts of digestive tract: Secondary | ICD-10-CM | POA: Diagnosis not present

## 2018-12-06 DIAGNOSIS — Z86010 Personal history of colonic polyps: Secondary | ICD-10-CM | POA: Diagnosis not present

## 2018-12-06 DIAGNOSIS — R109 Unspecified abdominal pain: Secondary | ICD-10-CM | POA: Diagnosis not present

## 2018-12-06 MED ORDER — IOHEXOL 300 MG/ML  SOLN
100.0000 mL | Freq: Once | INTRAMUSCULAR | Status: AC | PRN
  Administered 2018-12-06: 100 mL via INTRAVENOUS

## 2018-12-06 NOTE — Progress Notes (Signed)
Left message for patient to call back  

## 2018-12-13 LAB — FECAL LACTOFERRIN, QUANT: Lactoferrin, Fecal, Quant.: <1 ug/mL(g) (ref 0.00–7.24)

## 2018-12-30 ENCOUNTER — Encounter: Payer: Self-pay | Admitting: Gastroenterology

## 2018-12-30 ENCOUNTER — Other Ambulatory Visit: Payer: Self-pay

## 2018-12-30 ENCOUNTER — Ambulatory Visit: Payer: Medicare HMO | Admitting: Gastroenterology

## 2018-12-30 ENCOUNTER — Telehealth: Payer: Self-pay

## 2018-12-30 VITALS — BP 132/80 | HR 86 | Temp 98.0°F | Ht 65.0 in | Wt 212.1 lb

## 2018-12-30 DIAGNOSIS — K625 Hemorrhage of anus and rectum: Secondary | ICD-10-CM | POA: Diagnosis not present

## 2018-12-30 DIAGNOSIS — Z86010 Personal history of colonic polyps: Secondary | ICD-10-CM

## 2018-12-30 DIAGNOSIS — Z86007 Personal history of in-situ neoplasm of skin: Secondary | ICD-10-CM

## 2018-12-30 MED ORDER — HYDROCORTISONE (PERIANAL) 1 % EX CREA: 1.0000 "application " | TOPICAL_CREAM | Freq: Two times a day (BID) | CUTANEOUS | 2 refills | Status: DC

## 2018-12-30 MED ORDER — SUPREP BOWEL PREP KIT 17.5-3.13-1.6 GM/177ML PO SOLN: 1.0000 | ORAL | 0 refills | Status: DC

## 2018-12-30 NOTE — Patient Instructions (Addendum)
If you are age 75 or older, your body mass index should be between 23-30. Your Body mass index is 35.3 kg/m. If this is out of the aforementioned range listed, please consider follow up with your Primary Care Provider.  If you are age 60 or younger, your body mass index should be between 19-25. Your Body mass index is 35.3 kg/m. If this is out of the aformentioned range listed, please consider follow up with your Primary Care Provider.   Two days before your procedure: Mix 3 packs (or capfuls) of Miralax in 48 ounces of clear liquid and drink at 6pm.  We have given you samples of the following medication to take: Suprep  You have been scheduled for a colonoscopy. Please follow written instructions given to you at your visit today.  Please pick up your prep supplies at the pharmacy within the next 1-3 days. If you use inhalers (even only as needed), please bring them with you on the day of your procedure. Your physician has requested that you go to www.startemmi.com and enter the access code given to you at your visit today. This web site gives a general overview about your procedure. However, you should still follow specific instructions given to you by our office regarding your preparation for the procedure.  We have sent the following medications to your pharmacy for you to pick up at your convenience: Anusol  Stop all artificial sweeteners.  Thank you,  Dr. Jackquline Denmark

## 2018-12-30 NOTE — Progress Notes (Signed)
Chief Complaint: FU  Referring Provider:  Greig Right, MD      ASSESSMENT AND PLAN;   #1.  Rectal bleeding with rectal pain, lower abdo pain, intermittent diarrhea. Neg CT AB with IV contrast 11/2018. Nl CBC, CMP and TSH 11/2018. Neg stool for WBC.  #2.  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 Pauletta Browns. Bx-clear margins, HPV effect.  No need for chemo/XRT. Followed with serial FSs/colons. Last colon 06/2016 - neg.  #3.  H/O colonic polyps. Last colon 06/2016-fair prep, colonic polyps s/p polypectomy, mild sigmoid diverticulosis, rectal scar, internal hemorrhoids. Bx- TAs  Plan: - HC 2.5 % BID PR x 10 days to continue. (2 refills). - Proceed with colon with 2 day prep. Discussed risks and benefits. - Stop all artificial sweeteners. - Minimize NSAIDs.    HPI:    Caroline Osborne is a 73 y.o. female  S/P R knee replacement Feb 2020 Since then has been having problems with bowel movements. Initially got constipated Now intermittent diarrhea Associated with rectal bleeding intermittently more over the last 2 months.  Sometimes, mixed with the stool. Still would occasionally get constipated and would have change in stool caliber. Has been having more "flat" stools. Some associated lower abdominal pain mostly suprapubic and rectal, nonradiating, occasionally gets relieved by defecation. No unintentional weight loss  CT scan Abdo/pelvis was unremarkable.  CBC, CMP and TSH was normal.  The rectal bleeding is much better with Anusol.  She does bring in a stool specimen which looks like a normal stool.  Wants to get colon done.  Has been off all narcotics/antibiotics or any blood thinners except baby aspirin.  Past Medical History:  Diagnosis Date  . Arrhythmia   . Arthritis   . Chronic back pain   . GERD (gastroesophageal reflux disease)   . Hyperlipidemia   . Hypertension   . Internal hemorrhoids   . Rectal cancer [facility])     Past  Surgical History:  Procedure Laterality Date  . BACK SURGERY    . CHOLECYSTECTOMY    . COLON RESECTION    . COLONOSCOPY  07/12/2016   Colonic polyp status post polypectomy. Mild sigmoid diverticulosis.   Marland Kitchen ESOPHAGOGASTRODUODENOSCOPY  01/21/2003   Small hiatal hernia. Mild gastroduodenitis. Small gastric polyps s/p polypectomy.   . EYE SURGERY    . PARTIAL KNEE ARTHROPLASTY Left   . 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 TUBAL LIGATION      Family History  Problem Relation Age of Onset  . Hypertension Mother   . Breast cancer Mother   . Diabetes Father   . Hypertension Brother   . Colon cancer Neg Hx     Social History   Tobacco Use  . Smoking status: Never Smoker  . Smokeless tobacco: Never Used  Substance Use Topics  . Alcohol use: Never    Frequency: Never  . Drug use: Never    Current Outpatient Medications  Medication Sig Dispense Refill  . aspirin EC 81 MG tablet Take 81 mg by mouth as needed.     . Cholecalciferol (VITAMIN D-3 PO) Take 400 Units by mouth as needed.     . hydrocortisone (ANUSOL-HC) 2.5 % rectal cream Place 1 application rectally 2 (two) times daily.    Marland Kitchen lisinopril-hydrochlorothiazide (PRINZIDE,ZESTORETIC) 20-12.5 MG tablet Take 0.5 tablets by mouth daily.    . Melatonin 3 MG TABS Take 1 tablet by mouth as needed.    . metoprolol succinate (TOPROL-XL) 25 MG  24 hr tablet Take 0.5 tablets by mouth daily.    . Multiple Vitamin (MULTIVITAMIN) tablet Take 1 tablet by mouth daily.    . Omega 3 1000 MG CAPS Take 1 capsule by mouth daily.     No current facility-administered medications for this visit.     No Known Allergies  Review of Systems:  Constitutional: Denies fever, chills, diaphoresis, appetite change and fatigue.  Has OA     Physical Exam:    BP 132/80   Pulse 86   Temp 98 F (36.7 C)   Ht 5\' 5"  (1.651 m)   Wt 212 lb 2 oz (96.2 kg)   BMI 35.30 kg/m  Filed Weights   12/30/18 0936  Weight: 212 lb 2 oz (96.2 kg)   televisit   Data Reviewed: I have personally reviewed following labs and imaging studies  CBC: CBC Latest Ref Rng & Units 12/04/2018  WBC 3.4 - 10.8 x10E3/uL 7.5  Hemoglobin 11.1 - 15.9 g/dL 14.0  Hematocrit 34.0 - 46.6 % 42.0  Platelets 150 - 450 x10E3/uL 250    CMP: CMP Latest Ref Rng & Units 12/04/2018 06/04/2018  Glucose 65 - 99 mg/dL 100(H) 92  BUN 8 - 27 mg/dL 10 9  Creatinine 0.57 - 1.00 mg/dL 0.65 0.74  Sodium 134 - 144 mmol/L 142 139  Potassium 3.5 - 5.2 mmol/L 3.9 4.4  Chloride 96 - 106 mmol/L 100 98  CO2 20 - 29 mmol/L 24 25  Calcium 8.7 - 10.3 mg/dL 9.9 10.0      Carmell Austria, MD 12/30/2018, 9:48 AM  Cc: Greig Right, MD

## 2018-12-30 NOTE — Telephone Encounter (Signed)
Covid-19 screening questions   Do you now or have you had a fever in the last 14 days? No  Do you have any respiratory symptoms of shortness of breath or cough now or in the last 14 days? No  Do you have any family members or close contacts with diagnosed or suspected Covid-19 in the past 14 days? No  Have you been tested for Covid-19 and found to be positive? No        

## 2019-01-01 ENCOUNTER — Telehealth: Payer: Self-pay | Admitting: Gastroenterology

## 2019-01-01 NOTE — Telephone Encounter (Signed)

## 2019-01-02 ENCOUNTER — Ambulatory Visit (AMBULATORY_SURGERY_CENTER): Payer: Medicare HMO | Admitting: Gastroenterology

## 2019-01-02 ENCOUNTER — Other Ambulatory Visit: Payer: Self-pay

## 2019-01-02 ENCOUNTER — Encounter: Payer: Self-pay | Admitting: Gastroenterology

## 2019-01-02 VITALS — BP 147/81 | HR 68 | Temp 97.6°F | Resp 14 | Ht 65.0 in | Wt 212.0 lb

## 2019-01-02 DIAGNOSIS — C211 Malignant neoplasm of anal canal: Secondary | ICD-10-CM | POA: Diagnosis not present

## 2019-01-02 DIAGNOSIS — K625 Hemorrhage of anus and rectum: Secondary | ICD-10-CM | POA: Diagnosis not present

## 2019-01-02 DIAGNOSIS — Z85048 Personal history of other malignant neoplasm of rectum, rectosigmoid junction, and anus: Secondary | ICD-10-CM | POA: Diagnosis not present

## 2019-01-02 DIAGNOSIS — K635 Polyp of colon: Secondary | ICD-10-CM | POA: Diagnosis not present

## 2019-01-02 DIAGNOSIS — C218 Malignant neoplasm of overlapping sites of rectum, anus and anal canal: Secondary | ICD-10-CM | POA: Diagnosis not present

## 2019-01-02 DIAGNOSIS — Z86010 Personal history of colonic polyps: Secondary | ICD-10-CM | POA: Diagnosis not present

## 2019-01-02 DIAGNOSIS — I10 Essential (primary) hypertension: Secondary | ICD-10-CM | POA: Diagnosis not present

## 2019-01-02 MED ORDER — SODIUM CHLORIDE 0.9 % IV SOLN: 500.0000 mL | Freq: Once | INTRAVENOUS | Status: DC

## 2019-01-02 NOTE — Progress Notes (Signed)
Called to room to assist during endoscopic procedure.  Patient ID and intended procedure confirmed with present staff. Received instructions for my participation in the procedure from the performing physician.  

## 2019-01-02 NOTE — Op Note (Signed)
[place] Patient Name: Caroline Osborne Procedure Date: 01/02/2019 11:12 AM MRN: [id number] Endoscopist: Jackquline Denmark , MD Age: 73 Referring MD:  Date of Birth: 26-Jul-1945 Gender: Female Account #: [id number] Procedure:                Colonoscopy Indications:              #1. Rectal bleeding with rectal pain. Neg CT AB                            with IV contrast 11/2018. Nl CBC, CMP and TSH                            11/2018. Neg stool for WBC.                           #2. 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 The patient's tolerance of                            previous anesthesia was also reviewed. The risks                            and benefits of the procedure and the sedation                            options and risks were discussed with the patient.                            All questions were answered, and informed consent                            was obtained. Prior Anticoagulants: The patient has                            taken no previous anticoagulant or antiplatelet                            agents. ASA Grade Assessment: II - A patient with  mild systemic disease. After reviewing the risks                            and benefits, the patient was deemed in                            satisfactory condition to undergo the procedure.                           After obtaining informed consent, the colonoscope                            was passed under direct vision. Throughout the                            procedure, the patient's blood pressure, pulse, and            oxygen saturations were monitored continuously. The                            Colonoscope was introduced through the anus and                            advanced to the the cecum, identified by                            appendiceal orifice and ileocecal valve. The                            colonoscopy was performed without difficulty. The                            patient tolerated the procedure well. The quality                            of the bowel preparation was good. The ileocecal                            valve, appendiceal orifice, and rectum were                            photographed. Scope In: 11:24:43 AM Scope Out: 11:42:06 AM Scope Withdrawal Time: 0 hours 11 minutes 42 seconds  Total Procedure Duration: 0 hours 17 minutes 23 seconds  Findings:                 A 10 mm ulcerated, hard, polypoid lesion was found                            in the distal left posterior rectum (on rectal                            exam), just involving the dentate line. Best  visualized on the retroflexed examination, adjacent                            to the scar. This was biopsied with a cold forceps                            for histology. Estimated blood loss was minimal.                           A few small-mouthed diverticula were found in the                            sigmoid colon.                           The exam was otherwise without abnormality. Complications:            No immediate complications. Estimated Blood Loss:     Estimated blood loss: none. Impression:               - Polypoid ulcerated lesion in the distal rectum.                            Biopsied.                           - Mild sigmoid diverticulosis. Recommendation:           - Patient has a contact number available for                            emergencies. The signs and symptoms of potential                            delayed complications were discussed with the                             patient. Return to normal activities tomorrow.                            Written discharge instructions were provided to the                            patient.                           - Resume previous diet.                           - Continue present medications.                           - Await pathology results.                           - No aspirin, ibuprofen, naproxen, or other  non-steroidal anti-inflammatory drugs for 5 days                            after biopsy.                           - Return to GI clinic in 2 weeks. Jackquline Denmark, MD 01/02/2019 11:55:21 AM This report has been signed electronically.

## 2019-01-02 NOTE — Patient Instructions (Signed)
Thank you for allowing [place] to care for you today!  Await pathology results, approximately 1 week.  Dr Lyndel Safe will be contacting you with results.  Resume previous diet and medications today.  Avoid all NSAIDS for 5 days ( Ibuprofen, Alleve, Aspirin)  May take Tylenol for pain.  Return to GI Clinic in 2 weeks.  We will call to set up this appointment.     YOU HAD AN ENDOSCOPIC PROCEDURE TODAY AT [facility]:   Refer to the procedure report that was given to you for any specific questions about what was found during the examination.  If the procedure report does not answer your questions, please call your gastroenterologist to clarify.  If you requested that your care partner not be given the details of your procedure findings, then the procedure report has been included in a sealed envelope for you to review at your convenience later.  YOU SHOULD EXPECT: Some feelings of bloating in the abdomen. Passage of more gas than usual.  Walking can help get rid of the air that was put into your GI tract during the procedure and reduce the bloating. If you had a lower endoscopy (such as a colonoscopy or flexible sigmoidoscopy) you may notice spotting of blood in your stool or on the toilet paper. If you underwent a bowel prep for your procedure, you may not have a normal bowel movement for a few days.  Please Note:  You might notice some irritation and congestion in your nose or some drainage.  This is from the oxygen used during your procedure.  There is no need for concern and it should clear up in a day or so.  SYMPTOMS TO REPORT IMMEDIATELY:   Following lower endoscopy (colonoscopy or flexible sigmoidoscopy):  Excessive amounts of blood in the stool  Significant tenderness or worsening of abdominal pains  Swelling of the abdomen that is new, acute  Fever of 100F or higher   For urgent or emergent issues, a gastroenterologist can be reached at any hour by calling [phone]  [phone].   DIET:  We do recommend a small meal at first, but then you may proceed to your regular diet.  Drink plenty of fluids but you should avoid alcoholic beverages for 24 hours.  ACTIVITY:  You should plan to take it easy for the rest of today and you should NOT DRIVE or use heavy machinery until tomorrow (because of the sedation medicines used during the test).    FOLLOW UP: Our staff will call the number listed on your records 48-72 hours following your procedure to check on you and address any questions or concerns that you may have regarding the information given to you following your procedure. If we do not reach you, we will leave a message.  We will attempt to reach you two times.  During this call, we will ask if you have developed any symptoms of COVID 19. If you develop any symptoms (ie: fever, flu-like symptoms, shortness of breath, cough etc.) before then, please call [phone].  If you test positive for Covid 19 in the 2 weeks post procedure, please call and report this information to [place].    If any biopsies were taken you will be contacted by phone or by letter within the next 1-3 weeks.  Please call us at [phone] if you have not heard about the biopsies in 3 weeks.    SIGNATURES/CONFIDENTIALITY: You and/or your care partner have signed paperwork which will be entered  into your electronic medical record.  These signatures attest to the fact that that the information above on your After Visit Summary has been reviewed and is understood.  Full responsibility of the confidentiality of this discharge information lies with you and/or your care-partner.

## 2019-01-02 NOTE — Progress Notes (Signed)
Temp by Hiram Comber, Vitals by JG.

## 2019-01-02 NOTE — Progress Notes (Signed)
Report to PACU, RN, vss, BBS= Clear.  

## 2019-01-03 ENCOUNTER — Telehealth: Payer: Self-pay

## 2019-01-03 NOTE — Telephone Encounter (Signed)
Please call Dr. Lyndon Code and speak with him concerning this patient's patho results-

## 2019-01-06 ENCOUNTER — Telehealth: Payer: Self-pay | Admitting: *Deleted

## 2019-01-06 DIAGNOSIS — C210 Malignant neoplasm of anus, unspecified: Secondary | ICD-10-CM

## 2019-01-06 HISTORY — DX: Malignant neoplasm of anus, unspecified: C21.0

## 2019-01-06 NOTE — Telephone Encounter (Signed)
  Follow up Call-  Call back number 01/02/2019  Post procedure Call Back phone  # [phone]  Permission to leave phone message Yes  Some recent data might be hidden     Patient questions:  Do you have a fever, pain , or abdominal swelling? No. Pain Score  0 *  Have you tolerated food without any problems? Yes.    Have you been able to return to your normal activities? Yes.    Do you have any questions about your discharge instructions: Diet   No. Medications  No. Follow up visit  No.  Do you have questions or concerns about your Care? Yes.    Actions: * If pain score is 4 or above: No action needed, pain <4.  1. Have you developed a fever since your procedure? no  2.   Have you had an respiratory symptoms (SOB or cough) since your procedure? no  3.   Have you tested positive for COVID 19 since your procedure no  4.   Have you had any family members/close contacts diagnosed with the COVID 19 since your procedure?  no   If yes to any of these questions please route to Joylene John, RN and Alphonsa Gin, Therapist, sports.

## 2019-01-07 NOTE — Telephone Encounter (Signed)
Caroline Osborne called stating Dr. Lyndel Safe called her and she is sorry to have missed the call. If Dr Lyndel Safe can please call again she will keep her cell phone close. The number to her cell is [phone].

## 2019-01-10 ENCOUNTER — Telehealth: Payer: Self-pay | Admitting: Gastroenterology

## 2019-01-10 NOTE — Telephone Encounter (Signed)
Pt reported that she is to schedule surgery but she has not received a call to schedule.  Pt would like an update.

## 2019-01-10 NOTE — Telephone Encounter (Signed)
Called and spoke with patient-patient has been informed of referral to Dr. Catalina Gravel is accepting of this referral and will be notified of appt form Dr. Remi Deter office once appt has been made; patient advised to call back to the office if she is not notified of an appt with Dr. Hinton Rao within the next week; patient verbalized understanding of information/instructions;   Called and spoke with Dr. Jon Gills will notify the patient of appt date/time;

## 2019-01-17 DIAGNOSIS — Z7901 Long term (current) use of anticoagulants: Secondary | ICD-10-CM | POA: Diagnosis not present

## 2019-01-17 DIAGNOSIS — C20 Malignant neoplasm of rectum: Secondary | ICD-10-CM | POA: Diagnosis not present

## 2019-01-17 DIAGNOSIS — R970 Elevated carcinoembryonic antigen [CEA]: Secondary | ICD-10-CM | POA: Diagnosis not present

## 2019-01-20 DIAGNOSIS — M1711 Unilateral primary osteoarthritis, right knee: Secondary | ICD-10-CM | POA: Diagnosis not present

## 2019-01-21 ENCOUNTER — Ambulatory Visit: Payer: Medicare HMO | Admitting: Gastroenterology

## 2019-01-22 DIAGNOSIS — C20 Malignant neoplasm of rectum: Secondary | ICD-10-CM | POA: Diagnosis not present

## 2019-01-22 DIAGNOSIS — C218 Malignant neoplasm of overlapping sites of rectum, anus and anal canal: Secondary | ICD-10-CM | POA: Diagnosis not present

## 2019-01-28 DIAGNOSIS — C20 Malignant neoplasm of rectum: Secondary | ICD-10-CM | POA: Diagnosis not present

## 2019-02-04 DIAGNOSIS — C210 Malignant neoplasm of anus, unspecified: Secondary | ICD-10-CM | POA: Diagnosis not present

## 2019-02-05 ENCOUNTER — Telehealth: Payer: Self-pay | Admitting: Radiation Oncology

## 2019-02-05 NOTE — Telephone Encounter (Signed)
New message: ° ° °LVM for patient to return call to schedule appt from referral received. °

## 2019-02-07 DIAGNOSIS — C218 Malignant neoplasm of overlapping sites of rectum, anus and anal canal: Secondary | ICD-10-CM | POA: Diagnosis not present

## 2019-02-07 DIAGNOSIS — C20 Malignant neoplasm of rectum: Secondary | ICD-10-CM | POA: Diagnosis not present

## 2019-02-07 DIAGNOSIS — Z7901 Long term (current) use of anticoagulants: Secondary | ICD-10-CM | POA: Diagnosis not present

## 2019-02-10 DIAGNOSIS — E7800 Pure hypercholesterolemia, unspecified: Secondary | ICD-10-CM | POA: Diagnosis not present

## 2019-02-10 DIAGNOSIS — Z6836 Body mass index (BMI) 36.0-36.9, adult: Secondary | ICD-10-CM | POA: Diagnosis not present

## 2019-02-10 DIAGNOSIS — Z23 Encounter for immunization: Secondary | ICD-10-CM | POA: Diagnosis not present

## 2019-02-10 DIAGNOSIS — E669 Obesity, unspecified: Secondary | ICD-10-CM | POA: Diagnosis not present

## 2019-02-10 DIAGNOSIS — I10 Essential (primary) hypertension: Secondary | ICD-10-CM | POA: Diagnosis not present

## 2019-02-10 DIAGNOSIS — Z0000 Encounter for general adult medical examination without abnormal findings: Secondary | ICD-10-CM | POA: Diagnosis not present

## 2019-02-11 DIAGNOSIS — Z510 Encounter for antineoplastic radiation therapy: Secondary | ICD-10-CM | POA: Diagnosis not present

## 2019-02-11 DIAGNOSIS — C218 Malignant neoplasm of overlapping sites of rectum, anus and anal canal: Secondary | ICD-10-CM | POA: Diagnosis not present

## 2019-02-11 DIAGNOSIS — Z452 Encounter for adjustment and management of vascular access device: Secondary | ICD-10-CM | POA: Diagnosis not present

## 2019-02-13 DIAGNOSIS — C218 Malignant neoplasm of overlapping sites of rectum, anus and anal canal: Secondary | ICD-10-CM | POA: Diagnosis not present

## 2019-02-18 DIAGNOSIS — C218 Malignant neoplasm of overlapping sites of rectum, anus and anal canal: Secondary | ICD-10-CM | POA: Diagnosis not present

## 2019-02-18 DIAGNOSIS — Z510 Encounter for antineoplastic radiation therapy: Secondary | ICD-10-CM | POA: Diagnosis not present

## 2019-02-24 DIAGNOSIS — C218 Malignant neoplasm of overlapping sites of rectum, anus and anal canal: Secondary | ICD-10-CM | POA: Diagnosis not present

## 2019-02-24 DIAGNOSIS — Z5111 Encounter for antineoplastic chemotherapy: Secondary | ICD-10-CM | POA: Diagnosis not present

## 2019-02-24 DIAGNOSIS — Z510 Encounter for antineoplastic radiation therapy: Secondary | ICD-10-CM | POA: Diagnosis not present

## 2019-02-24 DIAGNOSIS — T82898A Other specified complication of vascular prosthetic devices, implants and grafts, initial encounter: Secondary | ICD-10-CM | POA: Diagnosis not present

## 2019-02-25 DIAGNOSIS — C218 Malignant neoplasm of overlapping sites of rectum, anus and anal canal: Secondary | ICD-10-CM | POA: Diagnosis not present

## 2019-02-25 DIAGNOSIS — Z510 Encounter for antineoplastic radiation therapy: Secondary | ICD-10-CM | POA: Diagnosis not present

## 2019-02-26 DIAGNOSIS — Z510 Encounter for antineoplastic radiation therapy: Secondary | ICD-10-CM | POA: Diagnosis not present

## 2019-02-26 DIAGNOSIS — C218 Malignant neoplasm of overlapping sites of rectum, anus and anal canal: Secondary | ICD-10-CM | POA: Diagnosis not present

## 2019-02-27 DIAGNOSIS — C218 Malignant neoplasm of overlapping sites of rectum, anus and anal canal: Secondary | ICD-10-CM | POA: Diagnosis not present

## 2019-02-27 DIAGNOSIS — Z510 Encounter for antineoplastic radiation therapy: Secondary | ICD-10-CM | POA: Diagnosis not present

## 2019-02-28 DIAGNOSIS — C218 Malignant neoplasm of overlapping sites of rectum, anus and anal canal: Secondary | ICD-10-CM | POA: Diagnosis not present

## 2019-03-03 DIAGNOSIS — C218 Malignant neoplasm of overlapping sites of rectum, anus and anal canal: Secondary | ICD-10-CM | POA: Diagnosis not present

## 2019-03-03 DIAGNOSIS — Z510 Encounter for antineoplastic radiation therapy: Secondary | ICD-10-CM | POA: Diagnosis not present

## 2019-03-04 DIAGNOSIS — C218 Malignant neoplasm of overlapping sites of rectum, anus and anal canal: Secondary | ICD-10-CM | POA: Diagnosis not present

## 2019-03-04 DIAGNOSIS — Z510 Encounter for antineoplastic radiation therapy: Secondary | ICD-10-CM | POA: Diagnosis not present

## 2019-03-05 DIAGNOSIS — Z510 Encounter for antineoplastic radiation therapy: Secondary | ICD-10-CM | POA: Diagnosis not present

## 2019-03-05 DIAGNOSIS — C218 Malignant neoplasm of overlapping sites of rectum, anus and anal canal: Secondary | ICD-10-CM | POA: Diagnosis not present

## 2019-03-06 DIAGNOSIS — C218 Malignant neoplasm of overlapping sites of rectum, anus and anal canal: Secondary | ICD-10-CM | POA: Diagnosis not present

## 2019-03-06 DIAGNOSIS — Z510 Encounter for antineoplastic radiation therapy: Secondary | ICD-10-CM | POA: Diagnosis not present

## 2019-03-07 DIAGNOSIS — C218 Malignant neoplasm of overlapping sites of rectum, anus and anal canal: Secondary | ICD-10-CM | POA: Diagnosis not present

## 2019-03-07 DIAGNOSIS — Z510 Encounter for antineoplastic radiation therapy: Secondary | ICD-10-CM | POA: Diagnosis not present

## 2019-03-10 DIAGNOSIS — D708 Other neutropenia: Secondary | ICD-10-CM | POA: Diagnosis not present

## 2019-03-10 DIAGNOSIS — E876 Hypokalemia: Secondary | ICD-10-CM | POA: Diagnosis not present

## 2019-03-10 DIAGNOSIS — D696 Thrombocytopenia, unspecified: Secondary | ICD-10-CM | POA: Diagnosis not present

## 2019-03-10 DIAGNOSIS — C218 Malignant neoplasm of overlapping sites of rectum, anus and anal canal: Secondary | ICD-10-CM | POA: Diagnosis not present

## 2019-03-10 DIAGNOSIS — R945 Abnormal results of liver function studies: Secondary | ICD-10-CM | POA: Diagnosis not present

## 2019-03-10 DIAGNOSIS — Z5189 Encounter for other specified aftercare: Secondary | ICD-10-CM | POA: Diagnosis not present

## 2019-03-11 DIAGNOSIS — C218 Malignant neoplasm of overlapping sites of rectum, anus and anal canal: Secondary | ICD-10-CM | POA: Diagnosis not present

## 2019-03-11 DIAGNOSIS — Z5189 Encounter for other specified aftercare: Secondary | ICD-10-CM | POA: Diagnosis not present

## 2019-03-12 DIAGNOSIS — C218 Malignant neoplasm of overlapping sites of rectum, anus and anal canal: Secondary | ICD-10-CM | POA: Diagnosis not present

## 2019-03-12 DIAGNOSIS — C211 Malignant neoplasm of anal canal: Secondary | ICD-10-CM | POA: Diagnosis not present

## 2019-03-13 DIAGNOSIS — Z510 Encounter for antineoplastic radiation therapy: Secondary | ICD-10-CM | POA: Diagnosis not present

## 2019-03-13 DIAGNOSIS — C218 Malignant neoplasm of overlapping sites of rectum, anus and anal canal: Secondary | ICD-10-CM | POA: Diagnosis not present

## 2019-03-14 DIAGNOSIS — Z510 Encounter for antineoplastic radiation therapy: Secondary | ICD-10-CM | POA: Diagnosis not present

## 2019-03-14 DIAGNOSIS — C218 Malignant neoplasm of overlapping sites of rectum, anus and anal canal: Secondary | ICD-10-CM | POA: Diagnosis not present

## 2019-03-17 DIAGNOSIS — C218 Malignant neoplasm of overlapping sites of rectum, anus and anal canal: Secondary | ICD-10-CM | POA: Diagnosis not present

## 2019-03-18 DIAGNOSIS — Z510 Encounter for antineoplastic radiation therapy: Secondary | ICD-10-CM | POA: Diagnosis not present

## 2019-03-18 DIAGNOSIS — C218 Malignant neoplasm of overlapping sites of rectum, anus and anal canal: Secondary | ICD-10-CM | POA: Diagnosis not present

## 2019-03-19 DIAGNOSIS — Z510 Encounter for antineoplastic radiation therapy: Secondary | ICD-10-CM | POA: Diagnosis not present

## 2019-03-19 DIAGNOSIS — C218 Malignant neoplasm of overlapping sites of rectum, anus and anal canal: Secondary | ICD-10-CM | POA: Diagnosis not present

## 2019-03-20 DIAGNOSIS — Z510 Encounter for antineoplastic radiation therapy: Secondary | ICD-10-CM | POA: Diagnosis not present

## 2019-03-20 DIAGNOSIS — C218 Malignant neoplasm of overlapping sites of rectum, anus and anal canal: Secondary | ICD-10-CM | POA: Diagnosis not present

## 2019-03-21 DIAGNOSIS — C218 Malignant neoplasm of overlapping sites of rectum, anus and anal canal: Secondary | ICD-10-CM | POA: Diagnosis not present

## 2019-03-21 DIAGNOSIS — Z510 Encounter for antineoplastic radiation therapy: Secondary | ICD-10-CM | POA: Diagnosis not present

## 2019-03-24 DIAGNOSIS — G4700 Insomnia, unspecified: Secondary | ICD-10-CM | POA: Diagnosis not present

## 2019-03-24 DIAGNOSIS — I4891 Unspecified atrial fibrillation: Secondary | ICD-10-CM | POA: Diagnosis not present

## 2019-03-24 DIAGNOSIS — I10 Essential (primary) hypertension: Secondary | ICD-10-CM | POA: Diagnosis not present

## 2019-03-24 DIAGNOSIS — T82528A Displacement of other cardiac and vascular devices and implants, initial encounter: Secondary | ICD-10-CM | POA: Diagnosis not present

## 2019-03-24 DIAGNOSIS — M1711 Unilateral primary osteoarthritis, right knee: Secondary | ICD-10-CM | POA: Diagnosis not present

## 2019-03-24 DIAGNOSIS — E7800 Pure hypercholesterolemia, unspecified: Secondary | ICD-10-CM | POA: Diagnosis not present

## 2019-03-24 DIAGNOSIS — C218 Malignant neoplasm of overlapping sites of rectum, anus and anal canal: Secondary | ICD-10-CM | POA: Diagnosis not present

## 2019-03-24 DIAGNOSIS — Z7982 Long term (current) use of aspirin: Secondary | ICD-10-CM | POA: Diagnosis not present

## 2019-03-24 DIAGNOSIS — K219 Gastro-esophageal reflux disease without esophagitis: Secondary | ICD-10-CM | POA: Diagnosis not present

## 2019-03-24 DIAGNOSIS — E785 Hyperlipidemia, unspecified: Secondary | ICD-10-CM | POA: Diagnosis not present

## 2019-03-24 DIAGNOSIS — Z96652 Presence of left artificial knee joint: Secondary | ICD-10-CM | POA: Diagnosis not present

## 2019-03-25 DIAGNOSIS — Z510 Encounter for antineoplastic radiation therapy: Secondary | ICD-10-CM | POA: Diagnosis not present

## 2019-03-25 DIAGNOSIS — C218 Malignant neoplasm of overlapping sites of rectum, anus and anal canal: Secondary | ICD-10-CM | POA: Diagnosis not present

## 2019-03-26 DIAGNOSIS — C218 Malignant neoplasm of overlapping sites of rectum, anus and anal canal: Secondary | ICD-10-CM | POA: Diagnosis not present

## 2019-03-26 DIAGNOSIS — Z510 Encounter for antineoplastic radiation therapy: Secondary | ICD-10-CM | POA: Diagnosis not present

## 2019-03-27 DIAGNOSIS — Z510 Encounter for antineoplastic radiation therapy: Secondary | ICD-10-CM | POA: Diagnosis not present

## 2019-03-27 DIAGNOSIS — C218 Malignant neoplasm of overlapping sites of rectum, anus and anal canal: Secondary | ICD-10-CM | POA: Diagnosis not present

## 2019-03-28 DIAGNOSIS — C218 Malignant neoplasm of overlapping sites of rectum, anus and anal canal: Secondary | ICD-10-CM | POA: Diagnosis not present

## 2019-03-28 DIAGNOSIS — Z510 Encounter for antineoplastic radiation therapy: Secondary | ICD-10-CM | POA: Diagnosis not present

## 2019-03-31 DIAGNOSIS — Z510 Encounter for antineoplastic radiation therapy: Secondary | ICD-10-CM | POA: Diagnosis not present

## 2019-03-31 DIAGNOSIS — C218 Malignant neoplasm of overlapping sites of rectum, anus and anal canal: Secondary | ICD-10-CM | POA: Diagnosis not present

## 2019-04-01 DIAGNOSIS — C218 Malignant neoplasm of overlapping sites of rectum, anus and anal canal: Secondary | ICD-10-CM | POA: Diagnosis not present

## 2019-04-01 DIAGNOSIS — Z510 Encounter for antineoplastic radiation therapy: Secondary | ICD-10-CM | POA: Diagnosis not present

## 2019-04-02 DIAGNOSIS — C218 Malignant neoplasm of overlapping sites of rectum, anus and anal canal: Secondary | ICD-10-CM | POA: Diagnosis not present

## 2019-04-02 DIAGNOSIS — Z510 Encounter for antineoplastic radiation therapy: Secondary | ICD-10-CM | POA: Diagnosis not present

## 2019-04-03 DIAGNOSIS — C218 Malignant neoplasm of overlapping sites of rectum, anus and anal canal: Secondary | ICD-10-CM | POA: Diagnosis not present

## 2019-04-03 DIAGNOSIS — Z510 Encounter for antineoplastic radiation therapy: Secondary | ICD-10-CM | POA: Diagnosis not present

## 2019-04-04 DIAGNOSIS — Z510 Encounter for antineoplastic radiation therapy: Secondary | ICD-10-CM | POA: Diagnosis not present

## 2019-04-04 DIAGNOSIS — C218 Malignant neoplasm of overlapping sites of rectum, anus and anal canal: Secondary | ICD-10-CM | POA: Diagnosis not present

## 2019-04-07 DIAGNOSIS — Z452 Encounter for adjustment and management of vascular access device: Secondary | ICD-10-CM | POA: Diagnosis not present

## 2019-04-07 DIAGNOSIS — C218 Malignant neoplasm of overlapping sites of rectum, anus and anal canal: Secondary | ICD-10-CM | POA: Diagnosis not present

## 2019-04-07 DIAGNOSIS — R339 Retention of urine, unspecified: Secondary | ICD-10-CM | POA: Diagnosis not present

## 2019-04-07 DIAGNOSIS — R945 Abnormal results of liver function studies: Secondary | ICD-10-CM | POA: Diagnosis not present

## 2019-04-07 DIAGNOSIS — D708 Other neutropenia: Secondary | ICD-10-CM | POA: Diagnosis not present

## 2019-04-07 DIAGNOSIS — D696 Thrombocytopenia, unspecified: Secondary | ICD-10-CM | POA: Diagnosis not present

## 2019-04-07 DIAGNOSIS — R300 Dysuria: Secondary | ICD-10-CM | POA: Diagnosis not present

## 2019-04-09 DIAGNOSIS — C218 Malignant neoplasm of overlapping sites of rectum, anus and anal canal: Secondary | ICD-10-CM

## 2019-04-15 DIAGNOSIS — R079 Chest pain, unspecified: Secondary | ICD-10-CM | POA: Diagnosis not present

## 2019-04-15 DIAGNOSIS — R945 Abnormal results of liver function studies: Secondary | ICD-10-CM | POA: Diagnosis not present

## 2019-04-15 DIAGNOSIS — D708 Other neutropenia: Secondary | ICD-10-CM | POA: Diagnosis not present

## 2019-04-15 DIAGNOSIS — R0602 Shortness of breath: Secondary | ICD-10-CM | POA: Diagnosis not present

## 2019-04-15 DIAGNOSIS — C218 Malignant neoplasm of overlapping sites of rectum, anus and anal canal: Secondary | ICD-10-CM | POA: Diagnosis not present

## 2019-04-15 DIAGNOSIS — D696 Thrombocytopenia, unspecified: Secondary | ICD-10-CM | POA: Diagnosis not present

## 2019-04-15 DIAGNOSIS — D6481 Anemia due to antineoplastic chemotherapy: Secondary | ICD-10-CM | POA: Diagnosis not present

## 2019-04-23 DIAGNOSIS — R945 Abnormal results of liver function studies: Secondary | ICD-10-CM | POA: Diagnosis not present

## 2019-04-23 DIAGNOSIS — D696 Thrombocytopenia, unspecified: Secondary | ICD-10-CM | POA: Diagnosis not present

## 2019-04-23 DIAGNOSIS — D6481 Anemia due to antineoplastic chemotherapy: Secondary | ICD-10-CM | POA: Diagnosis not present

## 2019-04-23 DIAGNOSIS — D708 Other neutropenia: Secondary | ICD-10-CM | POA: Diagnosis not present

## 2019-04-23 DIAGNOSIS — C218 Malignant neoplasm of overlapping sites of rectum, anus and anal canal: Secondary | ICD-10-CM | POA: Diagnosis not present

## 2019-05-07 DIAGNOSIS — C218 Malignant neoplasm of overlapping sites of rectum, anus and anal canal: Secondary | ICD-10-CM | POA: Diagnosis not present

## 2019-05-07 DIAGNOSIS — D708 Other neutropenia: Secondary | ICD-10-CM | POA: Diagnosis not present

## 2019-05-12 DIAGNOSIS — M545 Low back pain: Secondary | ICD-10-CM | POA: Diagnosis not present

## 2019-05-12 DIAGNOSIS — M4726 Other spondylosis with radiculopathy, lumbar region: Secondary | ICD-10-CM | POA: Diagnosis not present

## 2019-05-12 DIAGNOSIS — M4316 Spondylolisthesis, lumbar region: Secondary | ICD-10-CM | POA: Diagnosis not present

## 2019-05-21 DIAGNOSIS — C218 Malignant neoplasm of overlapping sites of rectum, anus and anal canal: Secondary | ICD-10-CM | POA: Diagnosis not present

## 2019-06-05 DIAGNOSIS — M79604 Pain in right leg: Secondary | ICD-10-CM | POA: Diagnosis not present

## 2019-06-05 DIAGNOSIS — C211 Malignant neoplasm of anal canal: Secondary | ICD-10-CM | POA: Diagnosis not present

## 2019-06-05 DIAGNOSIS — D6481 Anemia due to antineoplastic chemotherapy: Secondary | ICD-10-CM | POA: Diagnosis not present

## 2019-06-05 DIAGNOSIS — C218 Malignant neoplasm of overlapping sites of rectum, anus and anal canal: Secondary | ICD-10-CM | POA: Diagnosis not present

## 2019-06-05 DIAGNOSIS — M545 Low back pain: Secondary | ICD-10-CM | POA: Diagnosis not present

## 2019-06-05 DIAGNOSIS — B373 Candidiasis of vulva and vagina: Secondary | ICD-10-CM | POA: Diagnosis not present

## 2019-06-12 DIAGNOSIS — C211 Malignant neoplasm of anal canal: Secondary | ICD-10-CM | POA: Diagnosis not present

## 2019-06-12 DIAGNOSIS — I10 Essential (primary) hypertension: Secondary | ICD-10-CM | POA: Diagnosis not present

## 2019-06-12 DIAGNOSIS — I4891 Unspecified atrial fibrillation: Secondary | ICD-10-CM | POA: Diagnosis not present

## 2019-06-12 DIAGNOSIS — E669 Obesity, unspecified: Secondary | ICD-10-CM | POA: Diagnosis not present

## 2019-06-12 DIAGNOSIS — Z6833 Body mass index (BMI) 33.0-33.9, adult: Secondary | ICD-10-CM | POA: Diagnosis not present

## 2019-07-18 DIAGNOSIS — C218 Malignant neoplasm of overlapping sites of rectum, anus and anal canal: Secondary | ICD-10-CM | POA: Diagnosis not present

## 2019-07-18 DIAGNOSIS — D708 Other neutropenia: Secondary | ICD-10-CM | POA: Diagnosis not present

## 2019-07-18 DIAGNOSIS — C211 Malignant neoplasm of anal canal: Secondary | ICD-10-CM | POA: Diagnosis not present

## 2019-07-18 DIAGNOSIS — Z85048 Personal history of other malignant neoplasm of rectum, rectosigmoid junction, and anus: Secondary | ICD-10-CM | POA: Diagnosis not present

## 2019-07-23 DIAGNOSIS — C20 Malignant neoplasm of rectum: Secondary | ICD-10-CM | POA: Diagnosis not present

## 2019-07-23 DIAGNOSIS — C218 Malignant neoplasm of overlapping sites of rectum, anus and anal canal: Secondary | ICD-10-CM | POA: Diagnosis not present

## 2019-08-15 ENCOUNTER — Ambulatory Visit: Payer: Medicare HMO | Admitting: Cardiology

## 2019-09-03 DIAGNOSIS — M79604 Pain in right leg: Secondary | ICD-10-CM | POA: Diagnosis not present

## 2019-09-03 DIAGNOSIS — D708 Other neutropenia: Secondary | ICD-10-CM

## 2019-09-03 DIAGNOSIS — B373 Candidiasis of vulva and vagina: Secondary | ICD-10-CM | POA: Diagnosis not present

## 2019-09-03 DIAGNOSIS — D6481 Anemia due to antineoplastic chemotherapy: Secondary | ICD-10-CM | POA: Diagnosis not present

## 2019-09-03 DIAGNOSIS — C218 Malignant neoplasm of overlapping sites of rectum, anus and anal canal: Secondary | ICD-10-CM

## 2019-09-03 DIAGNOSIS — M545 Low back pain: Secondary | ICD-10-CM | POA: Diagnosis not present

## 2019-09-03 DIAGNOSIS — C211 Malignant neoplasm of anal canal: Secondary | ICD-10-CM

## 2019-09-08 DIAGNOSIS — Z20828 Contact with and (suspected) exposure to other viral communicable diseases: Secondary | ICD-10-CM | POA: Diagnosis not present

## 2019-09-09 DIAGNOSIS — I4891 Unspecified atrial fibrillation: Secondary | ICD-10-CM | POA: Diagnosis not present

## 2019-09-09 DIAGNOSIS — I10 Essential (primary) hypertension: Secondary | ICD-10-CM | POA: Diagnosis not present

## 2019-09-09 DIAGNOSIS — Z79899 Other long term (current) drug therapy: Secondary | ICD-10-CM | POA: Diagnosis not present

## 2019-09-09 DIAGNOSIS — C218 Malignant neoplasm of overlapping sites of rectum, anus and anal canal: Secondary | ICD-10-CM | POA: Diagnosis not present

## 2019-09-09 DIAGNOSIS — Z452 Encounter for adjustment and management of vascular access device: Secondary | ICD-10-CM | POA: Diagnosis not present

## 2019-10-09 DIAGNOSIS — E669 Obesity, unspecified: Secondary | ICD-10-CM | POA: Diagnosis not present

## 2019-10-09 DIAGNOSIS — I10 Essential (primary) hypertension: Secondary | ICD-10-CM | POA: Diagnosis not present

## 2019-10-09 DIAGNOSIS — Z6833 Body mass index (BMI) 33.0-33.9, adult: Secondary | ICD-10-CM | POA: Diagnosis not present

## 2019-10-09 DIAGNOSIS — E7800 Pure hypercholesterolemia, unspecified: Secondary | ICD-10-CM | POA: Diagnosis not present

## 2019-11-03 DIAGNOSIS — C218 Malignant neoplasm of overlapping sites of rectum, anus and anal canal: Secondary | ICD-10-CM | POA: Diagnosis not present

## 2020-01-05 DIAGNOSIS — M79651 Pain in right thigh: Secondary | ICD-10-CM | POA: Diagnosis not present

## 2020-01-05 DIAGNOSIS — K5900 Constipation, unspecified: Secondary | ICD-10-CM | POA: Diagnosis not present

## 2020-01-05 DIAGNOSIS — R300 Dysuria: Secondary | ICD-10-CM | POA: Diagnosis not present

## 2020-01-05 DIAGNOSIS — N898 Other specified noninflammatory disorders of vagina: Secondary | ICD-10-CM | POA: Diagnosis not present

## 2020-01-05 DIAGNOSIS — C218 Malignant neoplasm of overlapping sites of rectum, anus and anal canal: Secondary | ICD-10-CM | POA: Diagnosis not present

## 2020-01-05 DIAGNOSIS — D6481 Anemia due to antineoplastic chemotherapy: Secondary | ICD-10-CM | POA: Diagnosis not present

## 2020-01-05 DIAGNOSIS — M79652 Pain in left thigh: Secondary | ICD-10-CM | POA: Diagnosis not present

## 2020-01-12 DIAGNOSIS — D7389 Other diseases of spleen: Secondary | ICD-10-CM | POA: Diagnosis not present

## 2020-01-12 DIAGNOSIS — C218 Malignant neoplasm of overlapping sites of rectum, anus and anal canal: Secondary | ICD-10-CM | POA: Diagnosis not present

## 2020-01-12 DIAGNOSIS — R1084 Generalized abdominal pain: Secondary | ICD-10-CM | POA: Diagnosis not present

## 2020-01-12 DIAGNOSIS — K449 Diaphragmatic hernia without obstruction or gangrene: Secondary | ICD-10-CM | POA: Diagnosis not present

## 2020-01-12 DIAGNOSIS — I2510 Atherosclerotic heart disease of native coronary artery without angina pectoris: Secondary | ICD-10-CM | POA: Diagnosis not present

## 2020-01-12 DIAGNOSIS — C20 Malignant neoplasm of rectum: Secondary | ICD-10-CM | POA: Diagnosis not present

## 2020-01-12 DIAGNOSIS — M47814 Spondylosis without myelopathy or radiculopathy, thoracic region: Secondary | ICD-10-CM | POA: Diagnosis not present

## 2020-01-12 DIAGNOSIS — I700 Atherosclerosis of aorta: Secondary | ICD-10-CM | POA: Diagnosis not present

## 2020-02-05 DIAGNOSIS — Z6834 Body mass index (BMI) 34.0-34.9, adult: Secondary | ICD-10-CM | POA: Diagnosis not present

## 2020-02-05 DIAGNOSIS — Z85048 Personal history of other malignant neoplasm of rectum, rectosigmoid junction, and anus: Secondary | ICD-10-CM | POA: Diagnosis not present

## 2020-02-05 DIAGNOSIS — I4891 Unspecified atrial fibrillation: Secondary | ICD-10-CM | POA: Diagnosis not present

## 2020-02-05 DIAGNOSIS — E7800 Pure hypercholesterolemia, unspecified: Secondary | ICD-10-CM | POA: Diagnosis not present

## 2020-02-05 DIAGNOSIS — Z0000 Encounter for general adult medical examination without abnormal findings: Secondary | ICD-10-CM | POA: Diagnosis not present

## 2020-02-05 DIAGNOSIS — E669 Obesity, unspecified: Secondary | ICD-10-CM | POA: Diagnosis not present

## 2020-02-05 DIAGNOSIS — Z6835 Body mass index (BMI) 35.0-35.9, adult: Secondary | ICD-10-CM | POA: Diagnosis not present

## 2020-02-05 DIAGNOSIS — Z13820 Encounter for screening for osteoporosis: Secondary | ICD-10-CM | POA: Diagnosis not present

## 2020-02-05 DIAGNOSIS — I10 Essential (primary) hypertension: Secondary | ICD-10-CM | POA: Diagnosis not present

## 2020-02-24 DIAGNOSIS — Z23 Encounter for immunization: Secondary | ICD-10-CM | POA: Diagnosis not present

## 2020-03-05 DIAGNOSIS — M8589 Other specified disorders of bone density and structure, multiple sites: Secondary | ICD-10-CM | POA: Diagnosis not present

## 2020-03-05 DIAGNOSIS — Z1231 Encounter for screening mammogram for malignant neoplasm of breast: Secondary | ICD-10-CM | POA: Diagnosis not present

## 2020-03-05 DIAGNOSIS — M85852 Other specified disorders of bone density and structure, left thigh: Secondary | ICD-10-CM | POA: Diagnosis not present

## 2020-03-16 ENCOUNTER — Ambulatory Visit (INDEPENDENT_AMBULATORY_CARE_PROVIDER_SITE_OTHER): Payer: Medicare HMO | Admitting: Gastroenterology

## 2020-03-16 ENCOUNTER — Encounter: Payer: Self-pay | Admitting: Gastroenterology

## 2020-03-16 VITALS — BP 134/82 | HR 97 | Ht 65.0 in | Wt 207.1 lb

## 2020-03-16 DIAGNOSIS — K5900 Constipation, unspecified: Secondary | ICD-10-CM

## 2020-03-16 DIAGNOSIS — Z86007 Personal history of in-situ neoplasm of skin: Secondary | ICD-10-CM

## 2020-03-16 NOTE — Patient Instructions (Signed)
If you are age 75 or older, your body mass index should be between 23-30. Your Body mass index is 34.47 kg/m. If this is out of the aforementioned range listed, please consider follow up with your Primary Care Provider.  If you are age 63 or younger, your body mass index should be between 19-25. Your Body mass index is 34.47 kg/m. If this is out of the aformentioned range listed, please consider follow up with your Primary Care Provider.   Please purchase the following medications over the counter and take as directed: Miralax 17 grams once daily until a good bowel movement and then three times weekly.   Continue Metamucil.   Thank you,  Dr. Jackquline Denmark

## 2020-03-16 NOTE — Progress Notes (Signed)
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 Neg CT chest/abdo/pelvis 12/2019 at [facility].  No recurrence on rectal examination today. #2. 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 Bx-clear margins, HPV effect.  #3. Contipation #4. H/O colonic polyps 06/2016 small TAs. Last colon 12/2018 neg for polyps.  Plan: -Proceed with Flex Sig for further evaluation.  Today's rectal exam did not show any recurrence. -Add Miralax 17g po qd until good BM, then 3 times a week -Continue metamucil     HPI:    Caroline Osborne is a 74 y.o. female  For follow-up visit.  Dx with anorectal squamous cell carcinoma on colonoscopy 12/2018.  Subsequent work-up including CT/MRI revealed it to be stage IIIA.  She underwent chemo radiation with good results.  Negative CT chest Abdo/pelvis 01/09/2020 for any recurrence.  Here with constipation, pencil thin stools.  No melena or hematochezia.  No fever chills or night sweats.  She otherwise feels significantly better.  She has some lower abdominal discomfort which gets better with defecation.  Denies having any upper GI symptoms.   Past GI procedures: Colonoscopy 01/02/2019 - Polypoid ulcerated lesion in the distal rectum involving the dentate line. Bx-squamous cell carcinoma.. - Mild sigmoid diverticulosis.  MRI pelvis 12/2018: 3.5 cm with extension through the muscularis propria.  Mesorectal lymph node measuring 5 mm.  CT chest Abdo/pelvis 12/2019 [facility]: neg  Past Medical History:  Diagnosis Date  . Arrhythmia   . Arthritis   . Chronic back pain   . GERD (gastroesophageal reflux disease)   . Hyperlipidemia   . Hypertension   . Internal hemorrhoids   . Rectal cancer [facility])     Past Surgical History:  Procedure Laterality Date  . BACK SURGERY    . CHOLECYSTECTOMY    .  COLON RESECTION    . COLONOSCOPY  07/12/2016   Colonic polyp status post polypectomy. Mild sigmoid diverticulosis.   Marland Kitchen ESOPHAGOGASTRODUODENOSCOPY  01/21/2003   Small hiatal hernia. Mild gastroduodenitis. Small gastric polyps s/p polypectomy.   . EYE SURGERY    . PARTIAL KNEE ARTHROPLASTY Left   . 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 TUBAL LIGATION      Family History  Problem Relation Age of Onset  . Hypertension Mother   . Breast cancer Mother   . Diabetes Father   . Hypertension Brother   . Cancer Sister        all over   . Colon cancer Neg Hx     Social History   Tobacco Use  . Smoking status: Never Smoker  . Smokeless tobacco: Never Used  Vaping Use  . Vaping Use: Never used  Substance Use Topics  . Alcohol use: Never  . Drug use: Never    Current Outpatient Medications  Medication Sig Dispense Refill  . Ascorbic Acid (VITAMIN C PO) Take by mouth.    . Cholecalciferol (VITAMIN D-3 PO) Take 400 Units by mouth as needed.     Marland Kitchen lisinopril-hydrochlorothiazide (PRINZIDE,ZESTORETIC) 20-12.5 MG tablet Take 0.5 tablets by mouth daily.    . Melatonin 3 MG TABS Take 1 tablet by mouth as needed.    . metoprolol succinate (TOPROL-XL) 25 MG 24 hr tablet Take 0.5 tablets by mouth daily.    . Multiple Vitamin (MULTIVITAMIN) tablet Take 1 tablet by mouth daily.    Marland Kitchen  Omega 3 1000 MG CAPS Take 1 capsule by mouth daily.     Current Facility-Administered Medications  Medication Dose Route Frequency Provider Last Rate Last Admin  . 0.9 %  sodium chloride infusion  500 mL Intravenous Once Jackquline Denmark, MD        No Known Allergies  Review of Systems:  Constitutional: Denies fever, chills, diaphoresis, appetite change and fatigue.  Has OA     Physical Exam:    BP 134/82   Pulse 97   Ht 5\' 5"  (1.651 m)   Wt 207 lb 2 oz (94 kg)   BMI 34.47 kg/m  Filed Weights   03/16/20 1424  Weight: 207 lb 2 oz (94 kg)   Gen: awake, alert, NAD HEENT: anicteric, no pallor CV:  RRR, no mrg Pulm: CTA b/l Abd: soft, NT/ND, +BS throughout Rectal examination: Performed in presence of [place].  No obvious lesions.  No anal stenosis.  Stool brown heme neg Ext: no c/c/e Neuro: nonfocal   CBC: CBC Latest Ref Rng & Units 12/04/2018  WBC 3.4 - 10.8 x10E3/uL 7.5  Hemoglobin 11.1 - 15.9 g/dL 14.0  Hematocrit 34.0 - 46.6 % 42.0  Platelets 150 - 450 x10E3/uL 250    CMP: CMP Latest Ref Rng & Units 12/04/2018 06/04/2018  Glucose 65 - 99 mg/dL 100(H) 92  BUN 8 - 27 mg/dL 10 9  Creatinine 0.57 - 1.00 mg/dL 0.65 0.74  Sodium 134 - 144 mmol/L 142 139  Potassium 3.5 - 5.2 mmol/L 3.9 4.4  Chloride 96 - 106 mmol/L 100 98  CO2 20 - 29 mmol/L 24 25  Calcium 8.7 - 10.3 mg/dL 9.9 10.0      Carmell Austria, MD 03/16/2020, 2:46 PM  Cc: Greig Right, MD

## 2020-03-22 DIAGNOSIS — M542 Cervicalgia: Secondary | ICD-10-CM | POA: Diagnosis not present

## 2020-03-23 DIAGNOSIS — J929 Pleural plaque without asbestos: Secondary | ICD-10-CM | POA: Diagnosis not present

## 2020-03-23 DIAGNOSIS — M47812 Spondylosis without myelopathy or radiculopathy, cervical region: Secondary | ICD-10-CM | POA: Diagnosis not present

## 2020-03-23 DIAGNOSIS — M4312 Spondylolisthesis, cervical region: Secondary | ICD-10-CM | POA: Diagnosis not present

## 2020-03-23 DIAGNOSIS — M2578 Osteophyte, vertebrae: Secondary | ICD-10-CM | POA: Diagnosis not present

## 2020-03-23 DIAGNOSIS — M542 Cervicalgia: Secondary | ICD-10-CM | POA: Diagnosis not present

## 2020-03-30 ENCOUNTER — Other Ambulatory Visit: Payer: Self-pay | Admitting: Hematology and Oncology

## 2020-03-30 DIAGNOSIS — C218 Malignant neoplasm of overlapping sites of rectum, anus and anal canal: Secondary | ICD-10-CM

## 2020-04-07 ENCOUNTER — Other Ambulatory Visit: Payer: Self-pay

## 2020-04-07 ENCOUNTER — Inpatient Hospital Stay: Payer: Medicare HMO | Attending: Hematology and Oncology

## 2020-04-07 ENCOUNTER — Inpatient Hospital Stay (INDEPENDENT_AMBULATORY_CARE_PROVIDER_SITE_OTHER): Payer: Medicare HMO | Admitting: Hematology and Oncology

## 2020-04-07 VITALS — BP 124/71 | HR 91 | Temp 98.6°F | Resp 20 | Ht 64.0 in | Wt 205.6 lb

## 2020-04-07 DIAGNOSIS — C218 Malignant neoplasm of overlapping sites of rectum, anus and anal canal: Secondary | ICD-10-CM

## 2020-04-07 DIAGNOSIS — Z923 Personal history of irradiation: Secondary | ICD-10-CM | POA: Diagnosis not present

## 2020-04-07 LAB — CBC WITH DIFFERENTIAL (CANCER CENTER ONLY)
Abs Immature Granulocytes: 0.02 10*3/uL (ref 0.00–0.07)
Basophils Absolute: 0 10*3/uL (ref 0.0–0.1)
Basophils Relative: 1 %
Eosinophils Absolute: 0.3 10*3/uL (ref 0.0–0.5)
Eosinophils Relative: 4 %
HCT: 42.9 % (ref 36.0–46.0)
Hemoglobin: 13.7 g/dL (ref 12.0–15.0)
Immature Granulocytes: 0 %
Lymphocytes Relative: 36 %
Lymphs Abs: 2.6 10*3/uL (ref 0.7–4.0)
MCH: 30 pg (ref 26.0–34.0)
MCHC: 31.9 g/dL (ref 30.0–36.0)
MCV: 93.9 fL (ref 80.0–100.0)
Monocytes Absolute: 0.5 10*3/uL (ref 0.1–1.0)
Monocytes Relative: 7 %
Neutro Abs: 3.8 10*3/uL (ref 1.7–7.7)
Neutrophils Relative %: 52 %
Platelet Count: 237 10*3/uL (ref 150–400)
RBC: 4.57 MIL/uL (ref 3.87–5.11)
RDW: 12.4 % (ref 11.5–15.5)
WBC Count: 7.2 10*3/uL (ref 4.0–10.5)
nRBC: 0 % (ref 0.0–0.2)

## 2020-04-07 LAB — CMP (CANCER CENTER ONLY)
ALT: 17 U/L (ref 0–44)
AST: 21 U/L (ref 15–41)
Albumin: 4.2 g/dL (ref 3.5–5.0)
Alkaline Phosphatase: 87 U/L (ref 38–126)
Anion gap: 11 (ref 5–15)
BUN: 15 mg/dL (ref 8–23)
CO2: 27 mmol/L (ref 22–32)
Calcium: 9.8 mg/dL (ref 8.9–10.3)
Chloride: 97 mmol/L — ABNORMAL LOW (ref 98–111)
Creatinine: 0.78 mg/dL (ref 0.44–1.00)
GFR, Estimated: >60 mL/min (ref >60)
Glucose, Bld: 143 mg/dL — ABNORMAL HIGH (ref 70–99)
Potassium: 3.3 mmol/L — ABNORMAL LOW (ref 3.5–5.1)
Sodium: 135 mmol/L (ref 135–145)
Total Bilirubin: 1 mg/dL (ref 0.3–1.2)
Total Protein: 7.5 g/dL (ref 6.5–8.1)

## 2020-04-07 NOTE — Progress Notes (Signed)
Caroline Osborne  [address] [place],  [place]  [address] [phone]  Clinic Day:  04/07/2020  Referring physician: Greig Right, MD   CHIEF COMPLAINT:  CC:   Follow-up of rectal cancer.  Current Treatment:    Observation.   HISTORY OF PRESENT ILLNESS:  Caroline Osborne is a 74 y.o. 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  She presented with irregular bowel movements starting with constipation and then intermittent diarrhea, followed by a narrow stools and then bright red rectal bleeding.  She also had associated lower abdominal pain in the suprapubic, rectal, and buttock area.  She saw Dr. Lyndel Safe and underwent colonoscopy in August.  She was found to have a 10 mm ulcerated, hard polypoid lesion in the distal left posterior rectum involving the dentate line, as well as a few small mouthed diverticula in the sigmoid colon.  The lesion was adjacent to the prior scar.  Biopsy revealed invasive squamous cell carcinoma.  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  MRI pelvis for staging revealed the tumor size to be 3.5 cm with extension through the muscularis propria.  A mesorectal lymph node at the 4 o'clock position of the perirectal space measured 5 mm.  The patient received concurrent chemoradiation with mitomycin C and infusional 5 fluorouracil week 1 and week 5 of radiation.  She saw Dr. Leighton Ruff who agreed with this treatment and recommended she see the patient for follow-up 1 month after completion of therapy.  The patient completed chemoradiation in November.  She had acute urinary retention during radiation and a Foley had to be placed.  She had rectal pain and burning, for which she was given tramadol.  She otherwise tolerated treatment fairly well.  She then developed had back pain radiating down her right  leg.  Review of prior MRI lumbar spine from 2017 revealed significant moderate to severe multifactorial spinal stenosis multiple levels at that time, so she was instructed to use her tramadol as needed for her back pain as well.  CTA chest in November for shortness of breath did not reveal any evidence of pulmonary embolism or other acute cardiopulmonary abnormality.  Apparently, she did not follow up with Dr. Marcello Moores.    CT abdomen and pelvis in February did not reveal any evidence of disease.  No rectal mass was identified and no metastatic disease was noted.  At her visit in August, she reported constipation and thin stools, so repeat imaging was obtained. We recommended she use MiraLax daily for the constipation.  CT chest, abdomen and pelvis did not reveal any evidence of disease.  She also reported dark colored urine and vaginal discharge, but apparently could not give a urine specimen.  INTERVAL HISTORY:  Caroline Osborne is today for routine follow up and states she has been doing fairly well.  She has seen Dr. Lyndel Safe and is scheduled for colonoscopy on December 6th. She reports persistent narrow stools with alternating constipation and diarrhea.  She denies nausea or vomiting.  She denies abdominal or rectal pain.  She reports occasional dark stool, which she attributes to diet.  She denies hematochezia.  She reports persistent clear vaginal discharge without itching or burning.  She denies dysuria, hematuria, frequency or urgency of urination.Her urine appears normal. She denies fevers or chills. Her appetite is good.  She denies low back pain  at this time, she mainly has neck pain without radiation down her arms.  She rates her pain 3-4/10 today.  She uses Tylenol, tramadol and gabapentin as needed. Her weight has increased 5 pounds over last 3 months.  She has had 3 Pfizer COVID-19 vaccines. She has had her flu shot.  She is up-to-date on mammogram and bone density. She lost both her mother and her sister this  year and continues to grieve her losses.  She denies symptoms of major depression.  REVIEW OF SYSTEMS:  Review of Systems  Constitutional: Negative for appetite change, chills, fatigue and fever.  HENT:   Negative for lump/mass, mouth sores and sore throat.   Respiratory: Negative for cough and shortness of breath.   Cardiovascular: Negative for chest pain and leg swelling.  Gastrointestinal: Positive for constipation (alternating w/ diarrhea) and diarrhea. Negative for abdominal distention, abdominal pain, blood in stool, nausea, rectal pain and vomiting.  Genitourinary: Negative for bladder incontinence, difficulty urinating, dysuria, frequency and hematuria.   Musculoskeletal: Positive for neck pain (chronic, stable, no radiation). Negative for back pain and myalgias.  Neurological: Negative for dizziness and headaches.  Psychiatric/Behavioral: Positive for sleep disturbance (chronic insomnia, uses melatonin). Negative for depression. The patient is not nervous/anxious.      VITALS:  There were no vitals taken for this visit.  Wt Readings from Last 3 Encounters:  03/16/20 207 lb 2 oz (94 kg)  01/02/19 212 lb (96.2 kg)  12/30/18 212 lb 2 oz (96.2 kg)    There is no height or weight on file to calculate BMI.  Performance status (ECOG): 1 - Symptomatic but completely ambulatory  PHYSICAL EXAM:  Physical Exam Constitutional:      General: She is not in acute distress.    Appearance: Normal appearance.  HENT:     Mouth/Throat:     Mouth: Mucous membranes are moist.     Pharynx: Oropharynx is clear.  Eyes:     General: No scleral icterus.    Extraocular Movements: Extraocular movements intact.     Conjunctiva/sclera: Conjunctivae normal.     Pupils: Pupils are equal, round, and reactive to light.  Cardiovascular:     Rate and Rhythm: Normal rate and regular rhythm.     Heart sounds: Normal heart sounds. No murmur heard.  No friction rub. No gallop.   Pulmonary:     Effort:  No respiratory distress.     Breath sounds: Normal breath sounds. No stridor. No wheezing, rhonchi or rales.  Chest:     Chest wall: No tenderness.  Abdominal:     General: There is no distension.     Palpations: Abdomen is soft. There is no mass.     Tenderness: There is no abdominal tenderness. There is no guarding or rebound.     Hernia: No hernia is present.     Comments: Rectal exam is deferred due to upcoming colonoscopy.  Musculoskeletal:        General: No swelling.     Cervical back: Normal range of motion and neck supple.     Right lower leg: No edema.     Left lower leg: No edema.  Skin:    General: Skin is warm and dry.     Findings: No rash.  Neurological:     General: No focal deficit present.     Mental Status: She is alert and oriented to person, place, and time. Mental status is at baseline.     Cranial Nerves: No  cranial nerve deficit.  Psychiatric:        Mood and Affect: Mood normal.        Behavior: Behavior normal.   Rectal exam:  Deferred due to upcoming colonoscopy. Lymph nodes:   There is no cervical, clavicular, axillary or inguinal lymphadenopathy.   LABS:   CBC Latest Ref Rng & Units 12/04/2018  WBC 3.4 - 10.8 x10E3/uL 7.5  Hemoglobin 11.1 - 15.9 g/dL 14.0  Hematocrit 34.0 - 46.6 % 42.0  Platelets 150 - 450 x10E3/uL 250   CMP Latest Ref Rng & Units 12/04/2018 06/04/2018  Glucose 65 - 99 mg/dL 100(H) 92  BUN 8 - 27 mg/dL 10 9  Creatinine 0.57 - 1.00 mg/dL 0.65 0.74  Sodium 134 - 144 mmol/L 142 139  Potassium 3.5 - 5.2 mmol/L 3.9 4.4  Chloride 96 - 106 mmol/L 100 98  CO2 20 - 29 mmol/L 24 25  Calcium 8.7 - 10.3 mg/dL 9.9 10.0     No results found for: CEA1 / No results found for: CEA1 No results found for: PSA1 No results found for: ZOX096 No results found for: EAV409  No results found for: TOTALPROTELP, ALBUMINELP, A1GS, A2GS, BETS, BETA2SER, GAMS, MSPIKE, SPEI No results found for: TIBC, FERRITIN, IRONPCTSAT No results found for:  LDH  STUDIES:  No results found.    HISTORY:   Past Medical History:  Diagnosis Date  . Arrhythmia   . Arthritis   . Chronic back pain   . GERD (gastroesophageal reflux disease)   . Hyperlipidemia   . Hypertension   . Internal hemorrhoids   . Rectal cancer [facility])     Past Surgical History:  Procedure Laterality Date  . BACK SURGERY    . CHOLECYSTECTOMY    . COLON RESECTION    . COLONOSCOPY  07/12/2016   Colonic polyp status post polypectomy. Mild sigmoid diverticulosis.   Marland Kitchen ESOPHAGOGASTRODUODENOSCOPY  01/21/2003   Small hiatal hernia. Mild gastroduodenitis. Small gastric polyps s/p polypectomy.   . EYE SURGERY    . PARTIAL KNEE ARTHROPLASTY Left   . 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 TUBAL LIGATION      Family History  Problem Relation Age of Onset  . Hypertension Mother   . Breast cancer Mother   . Diabetes Father   . Hypertension Brother   . Cancer Sister        all over   . Colon cancer Neg Hx     Social History:  reports that she has never smoked. She has never used smokeless tobacco. She reports that she does not drink alcohol and does not use drugs.The patient is alone today.  Allergies: No Known Allergies  Current Medications: Current Outpatient Medications  Medication Sig Dispense Refill  . Ascorbic Acid (VITAMIN C PO) Take by mouth.    . Cholecalciferol (VITAMIN D-3 PO) Take 400 Units by mouth as needed.     Marland Kitchen lisinopril-hydrochlorothiazide (PRINZIDE,ZESTORETIC) 20-12.5 MG tablet Take 0.5 tablets by mouth daily.    . Melatonin 3 MG TABS Take 1 tablet by mouth as needed.    . metoprolol succinate (TOPROL-XL) 25 MG 24 hr tablet Take 0.5 tablets by mouth daily.    . Multiple Vitamin (MULTIVITAMIN) tablet Take 1 tablet by mouth daily.    . Omega 3 1000 MG CAPS Take 1 capsule by mouth daily.     Current Facility-Administered Medications  Medication Dose Route Frequency Provider Last Rate Last Admin  . 0.9 %  sodium chloride infusion  500  mL  Intravenous Once Jackquline Denmark, MD         ASSESSMENT & PLAN:   Assessment/Plan: 1. Stage IIIA anal squamous cell carcinoma, for which she has received definitive chemoradiation.   She remains without evidence of recurrence.  She will undergo colonoscopy in December. 2. Chronic pain, currently in the neck, felt to be most likely due to degenerative changes seen on previous MRI and prior surgery.  She has tramadol to use as needed.  3. Chronic vaginal discharge, likely due to radiation. 4. Persistent narrow stools, likely due to scarring.    We will plan to see her back in 3 months with a CBC and comprehensive metabolic panel for continued observation.  The patient understands the plans discussed today and is in agreement with them.    The patient knows to contact our office if her develops concerns prior to her next appointment.    Marvia Pickles, PA-C

## 2020-04-12 ENCOUNTER — Encounter: Payer: Self-pay | Admitting: Gastroenterology

## 2020-04-26 ENCOUNTER — Other Ambulatory Visit: Payer: Self-pay

## 2020-04-26 ENCOUNTER — Encounter: Payer: Self-pay | Admitting: Gastroenterology

## 2020-04-26 ENCOUNTER — Ambulatory Visit (AMBULATORY_SURGERY_CENTER): Payer: Medicare HMO | Admitting: Gastroenterology

## 2020-04-26 VITALS — BP 122/67 | HR 78 | Temp 96.6°F | Resp 14 | Ht 64.0 in | Wt 205.0 lb

## 2020-04-26 DIAGNOSIS — K621 Rectal polyp: Secondary | ICD-10-CM | POA: Diagnosis not present

## 2020-04-26 DIAGNOSIS — D128 Benign neoplasm of rectum: Secondary | ICD-10-CM

## 2020-04-26 DIAGNOSIS — Z86007 Personal history of in-situ neoplasm of skin: Secondary | ICD-10-CM

## 2020-04-26 DIAGNOSIS — Z85048 Personal history of other malignant neoplasm of rectum, rectosigmoid junction, and anus: Secondary | ICD-10-CM | POA: Diagnosis not present

## 2020-04-26 MED ORDER — SODIUM CHLORIDE 0.9 % IV SOLN: 500.0000 mL | INTRAVENOUS | Status: DC

## 2020-04-26 NOTE — Discharge Instructions (Signed)
Resume previous medications. Handouts on findings given to patient. Await pathology for final recommendations. Repeat exam in 6 months  YOU HAD AN ENDOSCOPIC PROCEDURE TODAY AT [facility]:   Refer to the procedure report that was given to you for any specific questions about what was found during the examination.  If the procedure report does not answer your questions, please call your gastroenterologist to clarify.  If you requested that your care partner not be given the details of your procedure findings, then the procedure report has been included in a sealed envelope for you to review at your convenience later.  YOU SHOULD EXPECT: Some feelings of bloating in the abdomen. Passage of more gas than usual.  Walking can help get rid of the air that was put into your GI tract during the procedure and reduce the bloating. If you had a lower endoscopy (such as a colonoscopy or flexible sigmoidoscopy) you may notice spotting of blood in your stool or on the toilet paper. If you underwent a bowel prep for your procedure, you may not have a normal bowel movement for a few days.  Please Note:  You might notice some irritation and congestion in your nose or some drainage.  This is from the oxygen used during your procedure.  There is no need for concern and it should clear up in a day or so.  SYMPTOMS TO REPORT IMMEDIATELY:   Following lower endoscopy (colonoscopy or flexible sigmoidoscopy):  Excessive amounts of blood in the stool  Significant tenderness or worsening of abdominal pains  Swelling of the abdomen that is new, acute  Fever of 100F or higher  For urgent or emergent issues, a gastroenterologist can be reached at any hour by calling [phone]. Do not use MyChart messaging for urgent concerns.    DIET:  We do recommend a small meal at first, but then you may proceed to your regular diet.  Drink plenty of fluids but you should avoid alcoholic beverages for 24  hours.  ACTIVITY:  You should plan to take it easy for the rest of today and you should NOT DRIVE or use heavy machinery until tomorrow (because of the sedation medicines used during the test).    FOLLOW UP: Our staff will call the number listed on your records 48-72 hours following your procedure to check on you and address any questions or concerns that you may have regarding the information given to you following your procedure. If we do not reach you, we will leave a message.  We will attempt to reach you two times.  During this call, we will ask if you have developed any symptoms of COVID 19. If you develop any symptoms (ie: fever, flu-like symptoms, shortness of breath, cough etc.) before then, please call [phone].  If you test positive for Covid 19 in the 2 weeks post procedure, please call and report this information to [place].    If any biopsies were taken you will be contacted by phone or by letter within the next 1-3 weeks.  Please call us at [phone] if you have not heard about the biopsies in 3 weeks.    SIGNATURES/CONFIDENTIALITY: You and/or your care partner have signed paperwork which will be entered into your electronic medical record.  These signatures attest to the fact that that the information above on your After Visit Summary has been reviewed and is understood.  Full responsibility of the confidentiality of this discharge information lies with you and/or your  care-partner.

## 2020-04-26 NOTE — Progress Notes (Signed)
Reviewed patient's medical/surgical and noted any changes since office visit.

## 2020-04-26 NOTE — Progress Notes (Signed)
Called to room to assist during endoscopic procedure.  Patient ID and intended procedure confirmed with present staff. Received instructions for my participation in the procedure from the performing physician.  

## 2020-04-26 NOTE — Progress Notes (Signed)
PT taken to PACU. Monitors in place. VSS. Report given to RN. 

## 2020-04-26 NOTE — Op Note (Signed)
[place] Patient Name: Caroline Osborne Procedure Date: 04/26/2020 9:10 AM MRN: [id number] Endoscopist: Jackquline Denmark , MD Age: 74 Referring MD:  Date of Birth: [birth year] Gender: Female Account #: [id number] Procedure:                Flexible Sigmoidoscopy Indications:              High risk colon cancer surveillance: Personal                            history of anal cancer. Anorectal sq cell Ca 12/2018                            Stage IIIA (T2N1M0) s/p ChemoXRT (mitomycin-C/5FU).                            Neg CT chest/abdo/pelvis 12/2019 at [facility]. Medicines:                Monitored Anesthesia Care Procedure:                Pre-Anesthesia Assessment:                           - Prior to the procedure, a History and Physical                            was performed, and patient medications and                            allergies were reviewed. The patient's tolerance of                            previous anesthesia was also reviewed. The risks                            and benefits of the procedure and the sedation                            options and risks were discussed with the patient.                            All questions were answered, and informed consent                            was obtained. Prior Anticoagulants: The patient has                            taken no previous anticoagulant or antiplatelet                            agents. ASA Grade Assessment: III - A patient with                            severe systemic disease. After reviewing the risks  and benefits, the patient was deemed in                            satisfactory condition to undergo the procedure.                           After obtaining informed consent, the scope was                            passed under direct vision. The Colonoscope was                            introduced through the anus and advanced to the the                            sigmoid  colon. The flexible sigmoidoscopy was                            accomplished without difficulty. The patient                            tolerated the procedure well. The quality of the                            bowel preparation was good. Scope In: Scope Out: Findings:                 The perianal and digital rectal examinations                            revealed a posterior scar and a small thrombosed                            internal hemorrhoid. There were no masses palpated.                           A 4 mm polyp was found in the distal rectum, best                            visualized on the retroflexed examination. The                            polyp was sessile. The polyp was removed with a hot                            snare. Resection and retrieval were complete.                           Multiple small patchy angioectasias without                            bleeding were found in the rectum s/o mild  radiation proctitis..                           A few small-mouthed diverticula were found in the                            sigmoid colon. Complications:            No immediate complications. Estimated Blood Loss:     Estimated blood loss: none. Impression:               -One 4 mm polyp in the distal rectum, removed with                            a hot snare. Resected and retrieved.                           -Mild radiation proctitis.                           -Mild sigmoid diverticulosis.                           -Scarring was noted in the anal canal and proximal                            rectum without any obvious masses. Recommendation:           - Patient has a contact number available for                            emergencies. The signs and symptoms of potential                            delayed complications were discussed with the                            patient. Return to normal activities tomorrow.                             Written discharge instructions were provided to the                            patient.                           - Resume previous diet.                           - Repeat flexible sigmoidoscopy in 6 months for                            surveillance.                           - The findings and recommendations were discussed  with the patient's family (Ray). Jackquline Denmark, MD 04/26/2020 9:41:06 AM This report has been signed electronically.

## 2020-04-28 ENCOUNTER — Telehealth: Payer: Self-pay | Admitting: *Deleted

## 2020-04-28 NOTE — Telephone Encounter (Signed)
  Follow up Call-  Call back number 04/26/2020 01/02/2019  Post procedure Call Back phone  # [phone]  Permission to leave phone message Yes Yes  Some recent data might be hidden     Patient questions:  Do you have a fever, pain , or abdominal swelling? No. Pain Score  0 *  Have you tolerated food without any problems? Yes.    Have you been able to return to your normal activities? Yes.    Do you have any questions about your discharge instructions: Diet   No. Medications  No. Follow up visit  No.  Do you have questions or concerns about your Care? No.  Actions: * If pain score is 4 or above: No action needed, pain <4.  1. Have you developed a fever since your procedure? no  2.   Have you had an respiratory symptoms (SOB or cough) since your procedure? no  3.   Have you tested positive for COVID 19 since your procedure no  4.   Have you had any family members/close contacts diagnosed with the COVID 19 since your procedure?  no   If yes to any of these questions please route to Joylene John, RN and Joella Prince, RN

## 2020-04-28 NOTE — Telephone Encounter (Signed)
  Follow up Call-  Call back number 04/26/2020 01/02/2019  Post procedure Call Back phone  # [phone]  Permission to leave phone message Yes Yes  Some recent data might be hidden     First attempt for follow up phone call. No answer at number given.  Left message on voicemail.

## 2020-04-30 ENCOUNTER — Encounter: Payer: Self-pay | Admitting: Gastroenterology

## 2020-05-26 DIAGNOSIS — Z6835 Body mass index (BMI) 35.0-35.9, adult: Secondary | ICD-10-CM | POA: Diagnosis not present

## 2020-05-26 DIAGNOSIS — I10 Essential (primary) hypertension: Secondary | ICD-10-CM | POA: Diagnosis not present

## 2020-05-26 DIAGNOSIS — E669 Obesity, unspecified: Secondary | ICD-10-CM | POA: Diagnosis not present

## 2020-05-26 DIAGNOSIS — E7800 Pure hypercholesterolemia, unspecified: Secondary | ICD-10-CM | POA: Diagnosis not present

## 2020-07-06 NOTE — Progress Notes (Incomplete)
[facility]  [address] [place],  [place]  [address] [phone]  Clinic Day:  07/06/2020  Referring physician: Greig Right, MD  This document serves as a record of services personally performed by Hosie Poisson, MD. It was created on their behalf by [facility], a trained medical scribe. The creation of this record is based on the scribe's personal observations and the provider's statements to them.  CHIEF COMPLAINT:  CC:   Follow-up of rectal cancer.  Current Treatment:    Observation.   HISTORY OF PRESENT ILLNESS:  Caroline Osborne is a 75 y.o. 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  She presented with irregular bowel movements starting with constipation and then intermittent diarrhea, followed by a narrow stools and then bright red rectal bleeding.  She also had associated lower abdominal pain in the suprapubic, rectal, and buttock area.  She saw Dr. Lyndel Safe and underwent colonoscopy in August.  She was found to have a 10 mm ulcerated, hard polypoid lesion in the distal left posterior rectum involving the dentate line, as well as a few small mouthed diverticula in the sigmoid colon.  The lesion was adjacent to the prior scar.  Biopsy revealed invasive squamous cell carcinoma.  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  MRI pelvis for staging revealed the tumor size to be 3.5 cm with extension through the muscularis propria.  A mesorectal lymph node at the 4 o'clock position of the perirectal space measured 5 mm.  The patient received concurrent chemoradiation with mitomycin C and infusional 5 fluorouracil week 1 and week 5 of radiation.  She saw Dr. Leighton Ruff who agreed with this treatment and recommended she see the patient for follow-up 1 month after completion of therapy.  The patient completed  chemoradiation in November.  She had acute urinary retention during radiation and a Foley had to be placed.  She had rectal pain and burning, for which she was given tramadol.  She otherwise tolerated treatment fairly well.  She then developed had back pain radiating down her right leg.  Review of prior MRI lumbar spine from 2017 revealed significant moderate to severe multifactorial spinal stenosis multiple levels at that time, so she was instructed to use her tramadol as needed for her back pain as well.  CTA chest in November for shortness of breath did not reveal any evidence of pulmonary embolism or other acute cardiopulmonary abnormality.  Apparently, she did not follow up with Dr. Marcello Moores.    CT abdomen and pelvis in February did not reveal any evidence of disease.  No rectal mass was identified and no metastatic disease was noted.  At her visit in August, she reported constipation and thin stools, so repeat imaging was obtained. We recommended she use MiraLax daily for the constipation.  CT chest, abdomen and pelvis did not reveal any evidence of disease.  She also reported dark colored urine and vaginal discharge, but apparently could not give a urine specimen.  INTERVAL HISTORY:  Caroline Osborne is today for routine follow up and states she has been doing fairly well.  She has seen Dr. Lyndel Safe and is scheduled for colonoscopy on December 6th. She reports persistent narrow stools with alternating constipation and diarrhea.  She denies nausea or vomiting.  She denies abdominal or rectal pain.  She reports occasional dark stool, which she attributes  to diet.  She denies hematochezia.  She reports persistent clear vaginal discharge without itching or burning.  She denies dysuria, hematuria, frequency or urgency of urination.Her urine appears normal. She denies fevers or chills. Her appetite is good.  She denies low back pain at this time, she mainly has neck pain without radiation down her arms.  She rates her pain  3-4/10 today.  She uses Tylenol, tramadol and gabapentin as needed. Her weight has increased 5 pounds over last 3 months.  She has had 3 Pfizer COVID-19 vaccines. She has had her flu shot.  She is up-to-date on mammogram and bone density. She lost both her mother and her sister this year and continues to grieve her losses.  She denies symptoms of major depression.  Caroline Osborne is here for routine follow up ***.   Her  appetite is good, and she has gained/lost _ pounds since her last visit.  She denies fever, chills or other signs of infection.  She denies nausea, vomiting, bowel issues, or abdominal pain.  She denies sore throat, cough, dyspnea, or chest pain.  REVIEW OF SYSTEMS:  Review of Systems - Oncology   VITALS:  There were no vitals taken for this visit.  Wt Readings from Last 3 Encounters:  04/26/20 205 lb (93 kg)  04/07/20 205 lb 9 oz (93.2 kg)  03/16/20 207 lb 2 oz (94 kg)    There is no height or weight on file to calculate BMI.  Performance status (ECOG): 1 - Symptomatic but completely ambulatory  PHYSICAL EXAM:  Physical ExamRectal exam:  Deferred due to upcoming colonoscopy. Lymph nodes:   There is no cervical, clavicular, axillary or inguinal lymphadenopathy.   LABS:   CBC Latest Ref Rng & Units 04/07/2020 12/04/2018  WBC 4.0 - 10.5 K/uL 7.2 7.5  Hemoglobin 12.0 - 15.0 g/dL 13.7 14.0  Hematocrit 36.0 - 46.0 % 42.9 42.0  Platelets 150 - 400 K/uL 237 250   CMP Latest Ref Rng & Units 04/07/2020 12/04/2018 06/04/2018  Glucose 70 - 99 mg/dL 143(H) 100(H) 92  BUN 8 - 23 mg/dL 15 10 9   Creatinine 0.44 - 1.00 mg/dL 0.78 0.65 0.74  Sodium 135 - 145 mmol/L 135 142 139  Potassium 3.5 - 5.1 mmol/L 3.3(L) 3.9 4.4  Chloride 98 - 111 mmol/L 97(L) 100 98  CO2 22 - 32 mmol/L 27 24 25   Calcium 8.9 - 10.3 mg/dL 9.8 9.9 10.0  Total Protein 6.5 - 8.1 g/dL 7.5 - -  Total Bilirubin 0.3 - 1.2 mg/dL 1.0 - -  Alkaline Phos 38 - 126 U/L 87 - -  AST 15 - 41 U/L 21 - -  ALT 0 - 44 U/L 17 -  -     No results found for: CEA1 / No results found for: CEA1 No results found for: PSA1 No results found for: GGE366 No results found for: QHU765  No results found for: TOTALPROTELP, ALBUMINELP, A1GS, A2GS, BETS, BETA2SER, GAMS, MSPIKE, SPEI No results found for: TIBC, FERRITIN, IRONPCTSAT No results found for: LDH  STUDIES:  No results found.    HISTORY:   Allergies: No Known Allergies  Current Medications: Current Outpatient Medications  Medication Sig Dispense Refill  . Ascorbic Acid (VITAMIN C PO) Take by mouth.    . Cholecalciferol (VITAMIN D-3 PO) Take 400 Units by mouth as needed.     Marland Kitchen lisinopril-hydrochlorothiazide (PRINZIDE,ZESTORETIC) 20-12.5 MG tablet Take 0.5 tablets by mouth daily.    . Melatonin 3 MG TABS Take 1 tablet  by mouth as needed.    . metoprolol succinate (TOPROL-XL) 25 MG 24 hr tablet Take 0.5 tablets by mouth daily.    . Multiple Vitamin (MULTIVITAMIN) tablet Take 1 tablet by mouth daily.    . Omega 3 1000 MG CAPS Take 1 capsule by mouth daily.     No current facility-administered medications for this visit.     ASSESSMENT & PLAN:   Assessment: 1. Stage IIIA anal squamous cell carcinoma, for which she has received definitive chemoradiation.   She remains without evidence of recurrence.  She will undergo colonoscopy in December.  2. Chronic pain, currently in the neck, felt to be most likely due to degenerative changes seen on previous MRI and prior surgery.  She has tramadol to use as needed.   3. Chronic vaginal discharge, likely due to radiation.  4. Persistent narrow stools, likely due to scarring.    Plan: We will plan to see her back in 3 months with a CBC and comprehensive metabolic panel for continued observation.  The patient understands the plans discussed today and is in agreement with them.   The patient knows to contact our office if her develops concerns prior to her next appointment.   I, Rita Ohara, am acting as scribe for  Derwood Kaplan, MD  I have reviewed this report as typed by the medical scribe, and it is complete and accurate.  Hermina Barters

## 2020-07-08 ENCOUNTER — Other Ambulatory Visit: Payer: Self-pay | Admitting: Oncology

## 2020-07-08 ENCOUNTER — Inpatient Hospital Stay: Payer: Medicare HMO

## 2020-07-08 ENCOUNTER — Inpatient Hospital Stay: Payer: Medicare HMO | Attending: Oncology | Admitting: Oncology

## 2020-07-08 DIAGNOSIS — C210 Malignant neoplasm of anus, unspecified: Secondary | ICD-10-CM

## 2020-10-11 IMAGING — CT CT ABDOMEN AND PELVIS WITH CONTRAST
2 of 5 series · 16 of 46 positions shown, 18 images · IV contrast (APPLIED)
Comparison: CT abdomen dated 11/01/2017

CLINICAL DATA: Rectal bleeding, abdominal pain, lethargy

EXAM:
CT ABDOMEN AND PELVIS WITH CONTRAST
TECHNIQUE: Multidetector CT imaging of the abdomen and pelvis was performed
using the standard protocol following bolus administration of
intravenous contrast.
CONTRAST:  100mL OMNIPAQUE IOHEXOL 300 MG/ML  SOLN

[Series 2: axial st · axial · 0.96mm/px · z∈[-523,-28]mm · 13 of 113 slices shown, 15 images]
[im 7/113  soft-tissue]
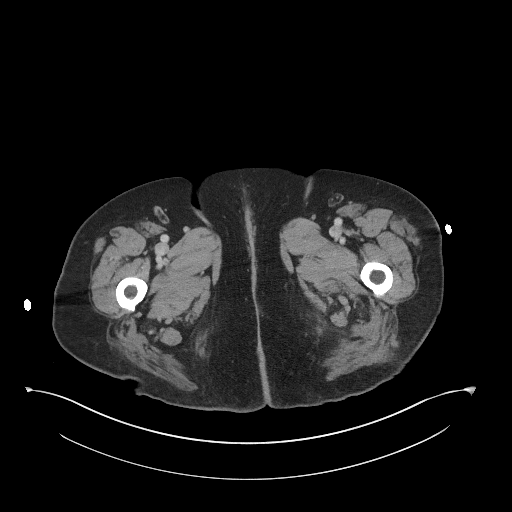
[im 7/113  bone]
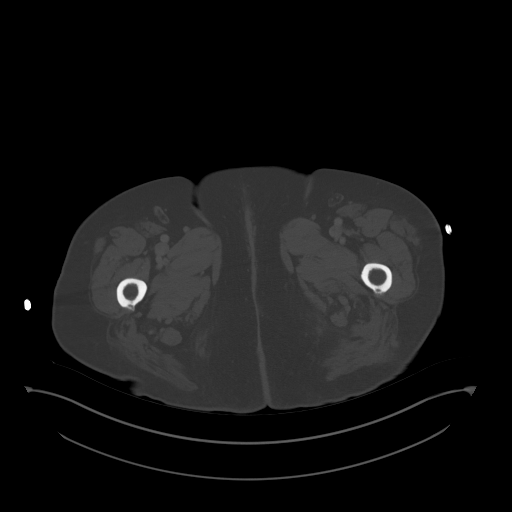
[im 13/113  soft-tissue]
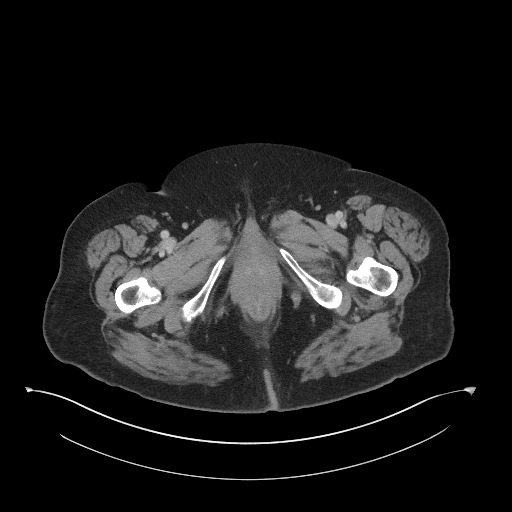
[im 25/113  soft-tissue]
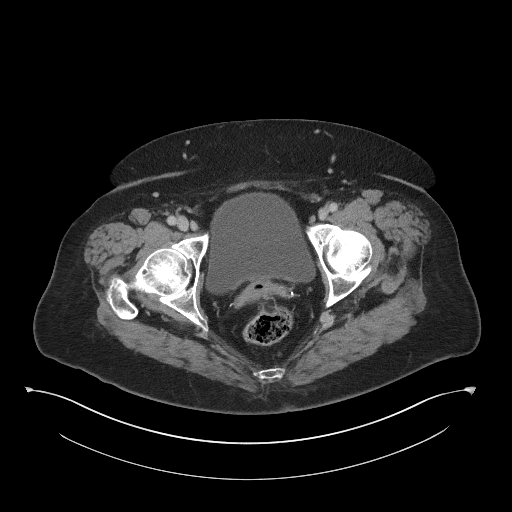
[im 32/113  soft-tissue]
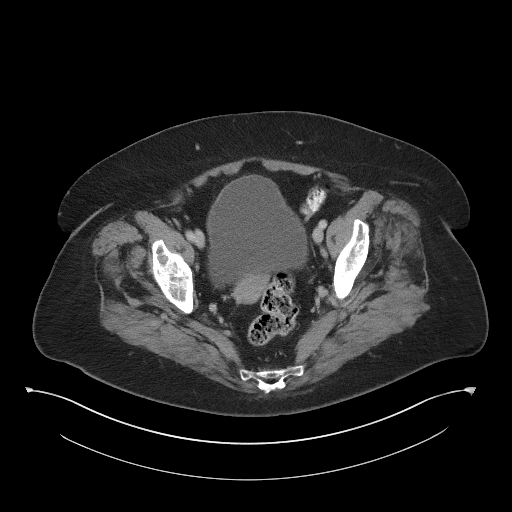
[im 38/113  soft-tissue]
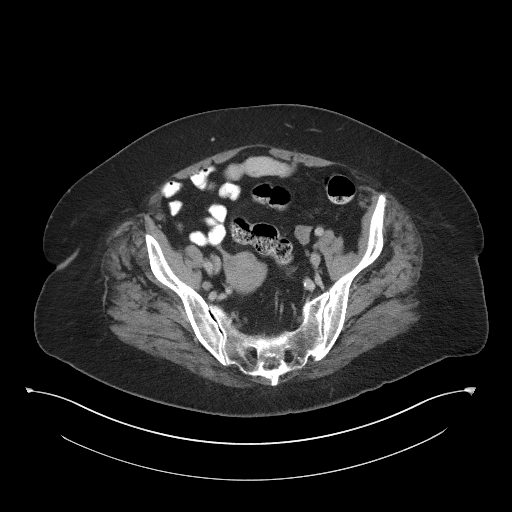
[im 50/113  soft-tissue]
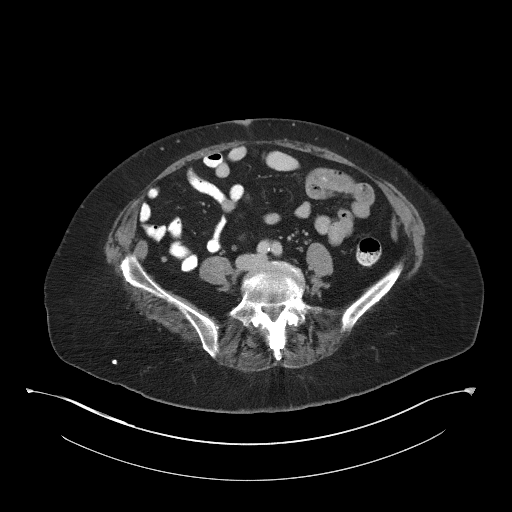
[im 57/113  soft-tissue]
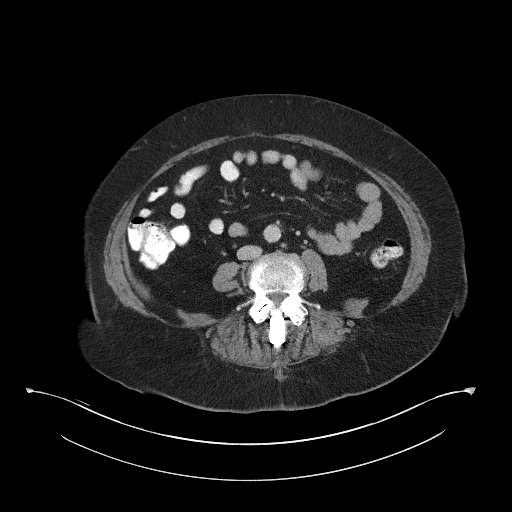
[im 63/113  soft-tissue]
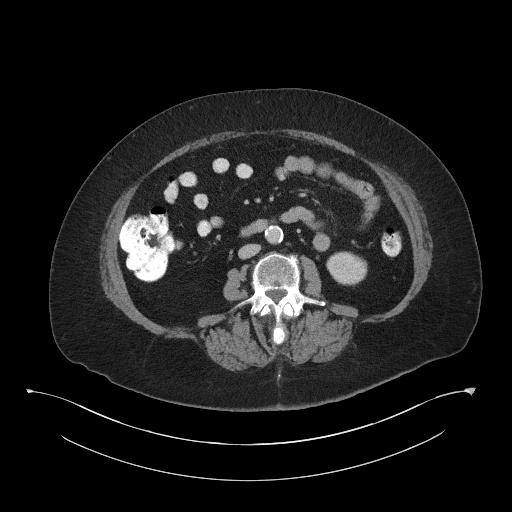
[im 75/113  soft-tissue]
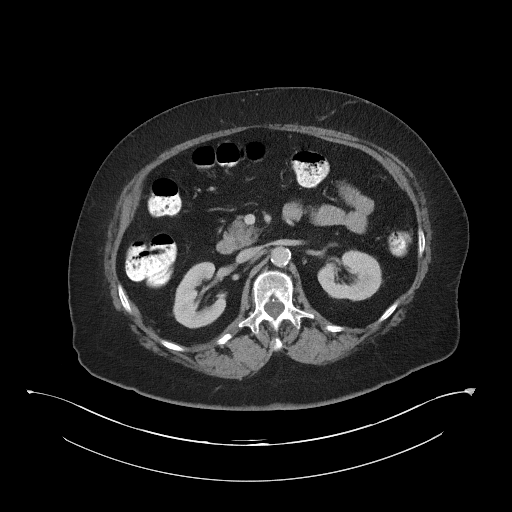
[im 75/113  bone]
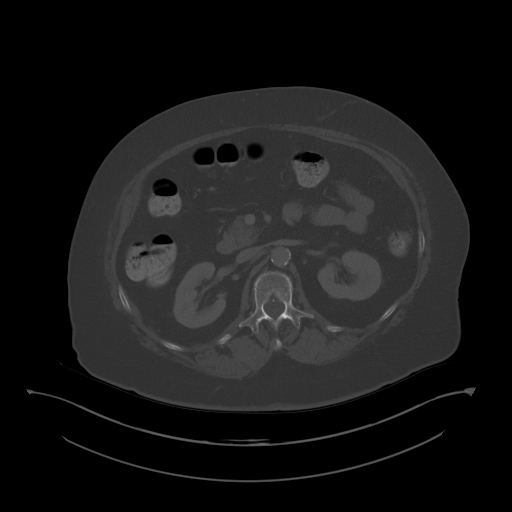
[im 81/113  soft-tissue]
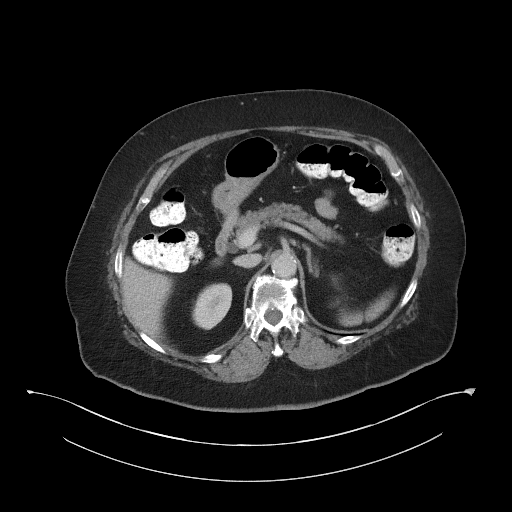
[im 88/113  soft-tissue]
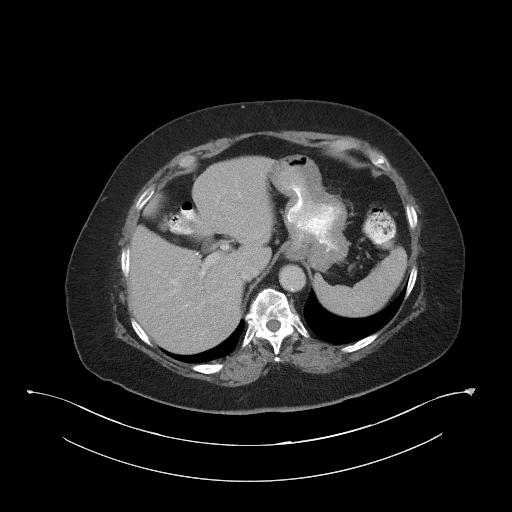
[im 100/113  soft-tissue]
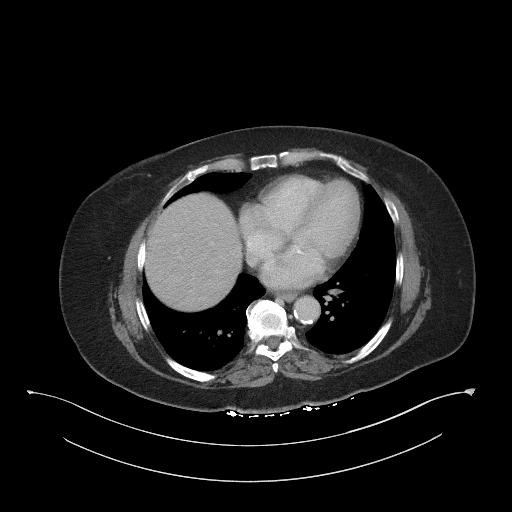
[im 106/113  soft-tissue]
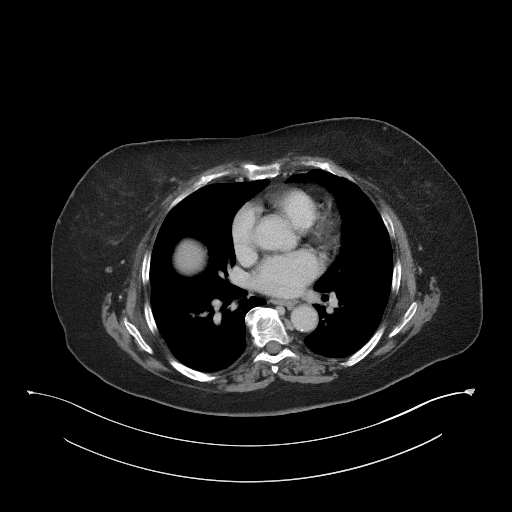

[Series 5: coronal st · coronal · 0.89mm/px · 3 of 101 slices shown]
[im 34/101  soft-tissue]
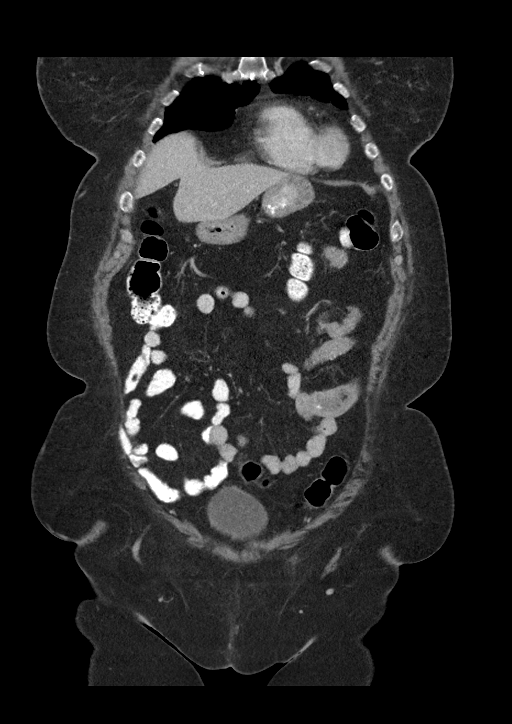
[im 45/101  soft-tissue]
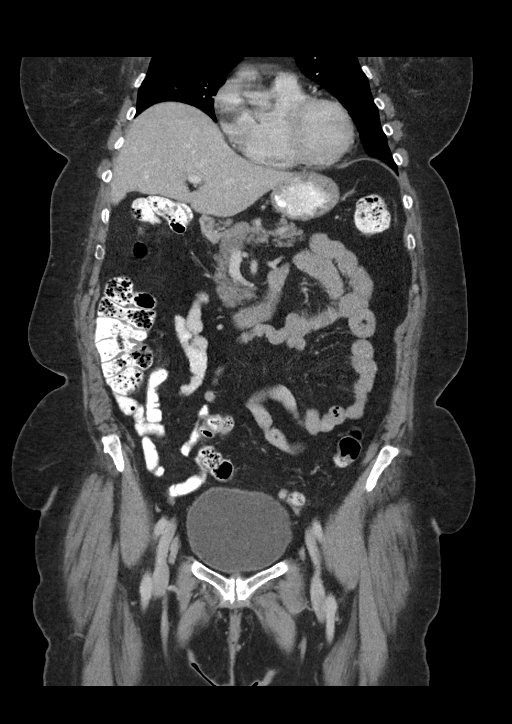
[im 56/101  soft-tissue]
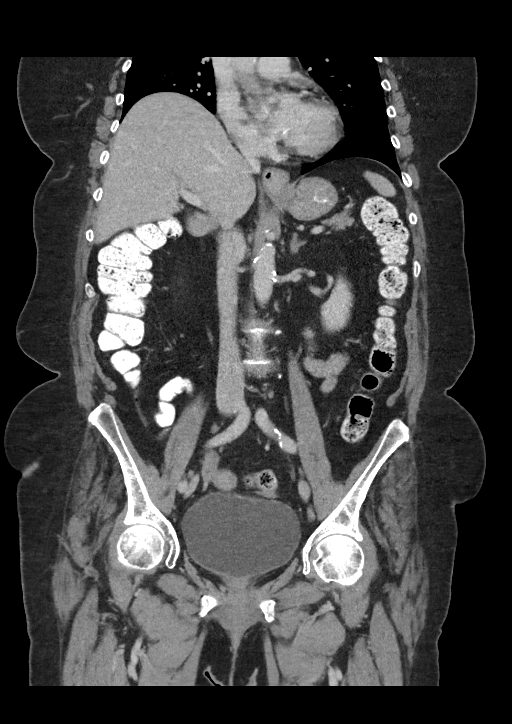

[16 of 46 positions shown; findings below may reference images not displayed]

FINDINGS: Lower chest: Lung bases are clear.

Hepatobiliary: Liver is within normal limits.

Status post cholecystectomy. No intrahepatic or extrahepatic duct
dilatation.

Pancreas: Within normal limits.

Spleen: Within normal limits.

Adrenals/Urinary Tract: Adrenal glands are within normal limits.

Kidneys are within normal limits.  No hydronephrosis.

Bladder is within normal limits.

Stomach/Bowel: Stomach is notable for a tiny hiatal hernia.

No evidence of bowel obstruction.

Normal appendix (series 2/image 62).

No colonic wall thickening or mass is evident on CT.

Vascular/Lymphatic: No evidence of abdominal aortic aneurysm.

Atherosclerotic calcifications of the abdominal aorta and branch
vessels.

No suspicious abdominopelvic lymphadenopathy.

Reproductive: Uterus is within normal limits.

Bilateral ovaries are within normal limits.

Other: No abdominopelvic ascites.

Musculoskeletal: Postsurgical changes at L3-4 and L4-5. Mild
degenerative changes of the visualized thoracolumbar spine.
IMPRESSION: No colonic wall thickening or mass evident on CT.

No evidence of bowel obstruction.  Normal appendix.

Status post cholecystectomy.

## 2020-10-12 DIAGNOSIS — J0100 Acute maxillary sinusitis, unspecified: Secondary | ICD-10-CM | POA: Diagnosis not present

## 2020-10-25 DIAGNOSIS — I10 Essential (primary) hypertension: Secondary | ICD-10-CM | POA: Diagnosis not present

## 2020-10-25 DIAGNOSIS — Z6834 Body mass index (BMI) 34.0-34.9, adult: Secondary | ICD-10-CM | POA: Diagnosis not present

## 2020-10-25 DIAGNOSIS — I480 Paroxysmal atrial fibrillation: Secondary | ICD-10-CM | POA: Diagnosis not present

## 2020-10-25 DIAGNOSIS — R252 Cramp and spasm: Secondary | ICD-10-CM | POA: Diagnosis not present

## 2020-10-25 DIAGNOSIS — G250 Essential tremor: Secondary | ICD-10-CM | POA: Diagnosis not present

## 2020-10-25 DIAGNOSIS — E7800 Pure hypercholesterolemia, unspecified: Secondary | ICD-10-CM | POA: Diagnosis not present

## 2020-10-25 DIAGNOSIS — E669 Obesity, unspecified: Secondary | ICD-10-CM | POA: Diagnosis not present

## 2020-11-01 ENCOUNTER — Encounter: Payer: Self-pay | Admitting: *Deleted

## 2020-11-01 DIAGNOSIS — M48061 Spinal stenosis, lumbar region without neurogenic claudication: Secondary | ICD-10-CM

## 2020-11-01 DIAGNOSIS — M4310 Spondylolisthesis, site unspecified: Secondary | ICD-10-CM

## 2020-11-01 DIAGNOSIS — K635 Polyp of colon: Secondary | ICD-10-CM

## 2020-11-01 DIAGNOSIS — I10 Essential (primary) hypertension: Secondary | ICD-10-CM | POA: Insufficient documentation

## 2020-11-01 DIAGNOSIS — N3281 Overactive bladder: Secondary | ICD-10-CM | POA: Insufficient documentation

## 2020-11-01 DIAGNOSIS — M1990 Unspecified osteoarthritis, unspecified site: Secondary | ICD-10-CM | POA: Insufficient documentation

## 2020-11-01 HISTORY — DX: Polyp of colon: K63.5

## 2020-11-01 HISTORY — DX: Spondylolisthesis, site unspecified: M43.10

## 2020-11-01 HISTORY — DX: Overactive bladder: N32.81

## 2020-11-01 HISTORY — DX: Spinal stenosis, lumbar region without neurogenic claudication: M48.061

## 2020-11-18 DIAGNOSIS — I10 Essential (primary) hypertension: Secondary | ICD-10-CM | POA: Diagnosis not present

## 2020-11-18 DIAGNOSIS — E7800 Pure hypercholesterolemia, unspecified: Secondary | ICD-10-CM | POA: Diagnosis not present

## 2020-11-29 NOTE — Progress Notes (Signed)
Cardiology Office Note:    Date:  11/30/2020   ID:  Caroline Osborne, DOB November 13, 1945, MRN [id number]  PCP:  Greig Right, MD  Cardiologist:  Shirlee More, MD    Referring MD: Greig Right, MD    ASSESSMENT:    1. Paroxysmal atrial fibrillation (HCC)   2. Hypertensive heart disease without heart failure   3. Hyperlipidemia, unspecified hyperlipidemia type    PLAN:    In order of problems listed above:  Clinically she has had no recurrence of atrial fibrillation but unfortunately has a CHA2DS2-VASc score for moderate stroke risk and she declines anticoagulation will take aspirin 81 mg daily.  If symptoms of palpitation flare she will contact me and we will utilize an event monitor.  At this time I would not initiate an antiarrhythmic drug and she will continue her low-dose beta-blocker this been very effective Blood pressure target continue current treatment thiazide diuretic ACE inhibitor Poorly controlled intolerant of statins Zetia and declines additional therapy   Next appointment: 1 year she will contact me should having breakthrough episodes of rapid heart rate and will utilize an ambulatory heart rhythm monitor   Medication Adjustments/Labs and Tests Ordered: Current medicines are reviewed at length with the patient today.  Concerns regarding medicines are outlined above.  No orders of the defined types were placed in this encounter.  No orders of the defined types were placed in this encounter.   Chief complaint, atrial fibrillation.   History of Present Illness:    Caroline Osborne is a 75 y.o. female with a hx of paroxysmal atrial fibrillation hypertension hyperlipidemia last seen 06/04/2018 preoperative prior to right total knee arthroplasty.  At that time she was in sinus rhythm and not anticoagulated.  She has moderate stroke risk with a CHADS2 score of 4.  Compliance with diet, lifestyle and medications: Yes  Intermittently she has  mild palpitation at nighttime not severe not sustained and not frequent.  We discussed an event monitor she does not think it happens often enough that we had captured and she will contact me if she is having more frequent or severe episodes and will initiate. We discussed anticoagulant therapy she got tearful spoke about her sister died and is decided to take low-dose aspirin. Overall is done well no sustained palpitations syncope neurologic symptoms edema shortness of breath or chest pain. Recent labs 0 05/26/2020 cholesterol 256 LDL 169 triglycerides 174 HDL 52 A1c 5.4%. She tells me she is intolerant of statins declines nonstatin therapy and Zetia told me she could not tolerate that in the past.  Past Medical History:  Diagnosis Date   Anal cancer ([place]) 01/06/2019   Arrhythmia    Arthritis    Atrial fibrillation ([place]) 06/04/2018   Chronic back pain    Colon polyp 11/01/2020   Formatting of this note might be different from the original. States prior removal of polyp that "could have turned into cancer."   GERD (gastroesophageal reflux disease)    Hyperlipidemia    Hypertension    Hypertensive heart disease 06/04/2018   Internal hemorrhoids    OAB (overactive bladder) 11/01/2020   Primary osteoarthritis of left knee 12/11/2014   Rectal cancer ([place])    Spinal stenosis of lumbar region 11/01/2020   Spondylolisthesis 11/01/2020    Past Surgical History:  Procedure Laterality Date   BACK SURGERY  02/2016   L3/L4, L4/L5 laminotomy/decompression and fixation   CHOLECYSTECTOMY     COLON RESECTION     COLONOSCOPY  07/12/2016   Colonic polyp status post polypectomy. Mild sigmoid diverticulosis.    ESOPHAGOGASTRODUODENOSCOPY  01/21/2003   Small hiatal hernia. Mild gastroduodenitis. Small gastric polyps s/p polypectomy.    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   lisinopril-hydrochlorothiazide (PRINZIDE,ZESTORETIC) 20-12.5 MG tablet Take 0.5 tablets by mouth daily.   Magnesium 400 MG TABS Take 1 tablet by mouth daily.   Melatonin 3 MG TABS Take 1 tablet by mouth at bedtime as needed (SLEEP).   metoprolol succinate (TOPROL-XL) 25 MG 24 hr tablet Take 0.5 tablets by mouth daily.   Multiple Vitamin (MULTIVITAMIN) tablet Take 1 tablet by mouth daily.   Omega 3 1000 MG CAPS Take 1 capsule by mouth daily.   Potassium 99 MG TABS Take 1 tablet by mouth daily.   VITAMIN C-VITAMIN D-ZINC PO Take 1 tablet by mouth daily.     Allergies:   Patient has no known allergies.   Social History   Socioeconomic History   Marital status: Widowed    Spouse name: Not on file   Number of children: Not on file   Years of education: Not on file   Highest education level: Not on file  Occupational History   Not on file  Tobacco Use   Smoking status: Never   Smokeless tobacco: Never  Vaping Use   Vaping Use: Never used  Substance and Sexual Activity   Alcohol use: Never   Drug use: Never   Sexual activity: Not on file  Other Topics Concern   Not on file  Social History Narrative   Not on file   Social Determinants of Health   Financial Resource Strain: Not on file  Food Insecurity: No Food Insecurity   Worried About Running Out of Food in the Last Year: Never true   [place] in the Last Year: Never true  Transportation Needs: No Transportation Needs   Lack of Transportation (Medical): No   Lack of Transportation (Non-Medical): No  Physical Activity: Insufficiently Active   Days of Exercise per Week: 2 days   Minutes of Exercise per Session: 30 min  Stress: Not on file  Social Connections: Not on file     Family History: The patient's family history includes Breast cancer in her mother; Cancer in her sister; Diabetes in her father; Hypertension in her brother, mother, and  sister; Thyroid disease in her mother. There is no history of Colon cancer, Esophageal cancer, Rectal cancer, or Stomach cancer. ROS:   Please see the history of present illness.    All other systems reviewed and are negative.  EKGs/Labs/Other Studies Reviewed:    The following studies were reviewed today:  EKG:  EKG ordered today and personally reviewed.  The ekg ordered today demonstrates sinus rhythm normal EKG  Recent Labs: 04/07/2020: ALT 17; BUN 15; Creatinine 0.78; Hemoglobin 13.7; Platelet Count 237; Potassium 3.3; Sodium 135  Recent Lipid Panel No results found for: CHOL, TRIG, HDL, CHOLHDL, VLDL, LDLCALC, LDLDIRECT  Physical Exam:    VS:  BP 128/78 (BP Location: Right Arm, Patient Position: Sitting, Cuff Size: Large)   Pulse 78   Ht 5\' 6"  (1.676 m)   Wt 209 lb 6.4 oz (95 kg)   SpO2 97%   BMI 33.80  kg/m     Wt Readings from Last 3 Encounters:  11/30/20 209 lb 6.4 oz (95 kg)  04/26/20 205 lb (93 kg)  04/07/20 205 lb 9 oz (93.2 kg)     GEN:  Well nourished, well developed in no acute distress HEENT: Normal NECK: No JVD; No carotid bruits LYMPHATICS: No lymphadenopathy CARDIAC: RRR, no murmurs, rubs, gallops RESPIRATORY:  Clear to auscultation without rales, wheezing or rhonchi  ABDOMEN: Soft, non-tender, non-distended MUSCULOSKELETAL:  No edema; No deformity  SKIN: Warm and dry NEUROLOGIC:  Alert and oriented x 3 PSYCHIATRIC:  Normal affect    Signed, Shirlee More, MD  11/30/2020 9:00 AM    [facility]

## 2020-11-30 ENCOUNTER — Other Ambulatory Visit: Payer: Self-pay

## 2020-11-30 ENCOUNTER — Encounter: Payer: Self-pay | Admitting: Cardiology

## 2020-11-30 ENCOUNTER — Ambulatory Visit: Payer: Medicare HMO | Admitting: Cardiology

## 2020-11-30 VITALS — BP 128/78 | HR 78 | Ht 66.0 in | Wt 209.4 lb

## 2020-11-30 DIAGNOSIS — I119 Hypertensive heart disease without heart failure: Secondary | ICD-10-CM

## 2020-11-30 DIAGNOSIS — E785 Hyperlipidemia, unspecified: Secondary | ICD-10-CM | POA: Diagnosis not present

## 2020-11-30 DIAGNOSIS — I480 Paroxysmal atrial fibrillation: Secondary | ICD-10-CM | POA: Diagnosis not present

## 2020-11-30 MED ORDER — ASPIRIN EC 81 MG PO TBEC: 81.0000 mg | DELAYED_RELEASE_TABLET | Freq: Every day | ORAL | 3 refills | Status: AC

## 2020-11-30 NOTE — Patient Instructions (Signed)
Medication Instructions:  Your physician has recommended you make the following change in your medication:   Start taking 81 mg coated aspirin daily.  *If you need a refill on your cardiac medications before your next appointment, please call your pharmacy*   Lab Work: None ordered If you have labs (blood work) drawn today and your tests are completely normal, you will receive your results only by: [place] (if you have MyChart) OR A paper copy in the mail If you have any lab test that is abnormal or we need to change your treatment, we will call you to review the results.   Testing/Procedures: None ordered   Follow-Up: At [facility], you and your health needs are our priority.  As part of our continuing mission to provide you with exceptional heart care, we have created designated Provider Care Teams.  These Care Teams include your primary Cardiologist (physician) and Advanced Practice Providers (APPs -  Physician Assistants and Nurse Practitioners) who all work together to provide you with the care you need, when you need it.  We recommend signing up for the patient portal called "MyChart".  Sign up information is provided on this After Visit Summary.  MyChart is used to connect with patients for Virtual Visits (Telemedicine).  Patients are able to view lab/test results, encounter notes, upcoming appointments, etc.  Non-urgent messages can be sent to your provider as well.   To learn more about what you can do with MyChart, go to [email or web address].    Your next appointment:   12 month(s)  The format for your next appointment:   In Person  Provider:   Jyl Heinz, MD   Other Instructions   Let Dr. Bettina Gavia know if you start having more palpitations so we can order a monitor.

## 2020-12-19 DIAGNOSIS — E7800 Pure hypercholesterolemia, unspecified: Secondary | ICD-10-CM | POA: Diagnosis not present

## 2020-12-19 DIAGNOSIS — I10 Essential (primary) hypertension: Secondary | ICD-10-CM | POA: Diagnosis not present

## 2020-12-19 DIAGNOSIS — I480 Paroxysmal atrial fibrillation: Secondary | ICD-10-CM | POA: Diagnosis not present

## 2021-01-05 DIAGNOSIS — C211 Malignant neoplasm of anal canal: Secondary | ICD-10-CM | POA: Diagnosis not present

## 2021-01-20 DIAGNOSIS — C211 Malignant neoplasm of anal canal: Secondary | ICD-10-CM | POA: Diagnosis not present

## 2021-01-20 DIAGNOSIS — K449 Diaphragmatic hernia without obstruction or gangrene: Secondary | ICD-10-CM | POA: Diagnosis not present

## 2021-01-20 DIAGNOSIS — I700 Atherosclerosis of aorta: Secondary | ICD-10-CM | POA: Diagnosis not present

## 2021-01-20 DIAGNOSIS — C218 Malignant neoplasm of overlapping sites of rectum, anus and anal canal: Secondary | ICD-10-CM | POA: Diagnosis not present

## 2021-01-20 DIAGNOSIS — D252 Subserosal leiomyoma of uterus: Secondary | ICD-10-CM | POA: Diagnosis not present

## 2021-01-25 DIAGNOSIS — M25559 Pain in unspecified hip: Secondary | ICD-10-CM | POA: Diagnosis not present

## 2021-02-01 DIAGNOSIS — M545 Low back pain, unspecified: Secondary | ICD-10-CM | POA: Diagnosis not present

## 2021-02-01 DIAGNOSIS — M47817 Spondylosis without myelopathy or radiculopathy, lumbosacral region: Secondary | ICD-10-CM | POA: Diagnosis not present

## 2021-02-01 DIAGNOSIS — M4316 Spondylolisthesis, lumbar region: Secondary | ICD-10-CM | POA: Diagnosis not present

## 2021-02-01 DIAGNOSIS — M1611 Unilateral primary osteoarthritis, right hip: Secondary | ICD-10-CM | POA: Diagnosis not present

## 2021-02-01 DIAGNOSIS — M25551 Pain in right hip: Secondary | ICD-10-CM | POA: Diagnosis not present

## 2021-02-01 DIAGNOSIS — M79651 Pain in right thigh: Secondary | ICD-10-CM | POA: Diagnosis not present

## 2021-02-01 DIAGNOSIS — M47816 Spondylosis without myelopathy or radiculopathy, lumbar region: Secondary | ICD-10-CM | POA: Diagnosis not present

## 2021-02-07 DIAGNOSIS — I700 Atherosclerosis of aorta: Secondary | ICD-10-CM | POA: Diagnosis not present

## 2021-02-07 DIAGNOSIS — Z6834 Body mass index (BMI) 34.0-34.9, adult: Secondary | ICD-10-CM | POA: Diagnosis not present

## 2021-02-07 DIAGNOSIS — E7800 Pure hypercholesterolemia, unspecified: Secondary | ICD-10-CM | POA: Diagnosis not present

## 2021-02-07 DIAGNOSIS — M545 Low back pain, unspecified: Secondary | ICD-10-CM | POA: Diagnosis not present

## 2021-02-07 DIAGNOSIS — I10 Essential (primary) hypertension: Secondary | ICD-10-CM | POA: Diagnosis not present

## 2021-02-07 DIAGNOSIS — Z0000 Encounter for general adult medical examination without abnormal findings: Secondary | ICD-10-CM | POA: Diagnosis not present

## 2021-02-07 DIAGNOSIS — Z85048 Personal history of other malignant neoplasm of rectum, rectosigmoid junction, and anus: Secondary | ICD-10-CM | POA: Diagnosis not present

## 2021-02-07 DIAGNOSIS — I4891 Unspecified atrial fibrillation: Secondary | ICD-10-CM | POA: Diagnosis not present

## 2021-02-07 DIAGNOSIS — E669 Obesity, unspecified: Secondary | ICD-10-CM | POA: Diagnosis not present

## 2021-02-08 DIAGNOSIS — M5459 Other low back pain: Secondary | ICD-10-CM | POA: Diagnosis not present

## 2021-02-08 DIAGNOSIS — M6281 Muscle weakness (generalized): Secondary | ICD-10-CM | POA: Diagnosis not present

## 2021-02-10 DIAGNOSIS — M549 Dorsalgia, unspecified: Secondary | ICD-10-CM | POA: Diagnosis not present

## 2021-02-10 DIAGNOSIS — M5416 Radiculopathy, lumbar region: Secondary | ICD-10-CM | POA: Diagnosis not present

## 2021-02-12 DIAGNOSIS — I10 Essential (primary) hypertension: Secondary | ICD-10-CM | POA: Diagnosis not present

## 2021-02-12 DIAGNOSIS — M545 Low back pain, unspecified: Secondary | ICD-10-CM | POA: Diagnosis not present

## 2021-02-12 DIAGNOSIS — G8929 Other chronic pain: Secondary | ICD-10-CM | POA: Diagnosis not present

## 2021-02-14 DIAGNOSIS — I10 Essential (primary) hypertension: Secondary | ICD-10-CM | POA: Diagnosis not present

## 2021-02-14 DIAGNOSIS — M4726 Other spondylosis with radiculopathy, lumbar region: Secondary | ICD-10-CM | POA: Diagnosis not present

## 2021-02-14 DIAGNOSIS — Z6832 Body mass index (BMI) 32.0-32.9, adult: Secondary | ICD-10-CM | POA: Diagnosis not present

## 2021-02-14 DIAGNOSIS — M48062 Spinal stenosis, lumbar region with neurogenic claudication: Secondary | ICD-10-CM | POA: Diagnosis not present

## 2021-02-14 DIAGNOSIS — M4316 Spondylolisthesis, lumbar region: Secondary | ICD-10-CM | POA: Diagnosis not present

## 2021-02-22 DIAGNOSIS — M4316 Spondylolisthesis, lumbar region: Secondary | ICD-10-CM | POA: Diagnosis not present

## 2021-02-22 DIAGNOSIS — M545 Low back pain, unspecified: Secondary | ICD-10-CM | POA: Diagnosis not present

## 2021-02-23 DIAGNOSIS — M5416 Radiculopathy, lumbar region: Secondary | ICD-10-CM | POA: Diagnosis not present

## 2021-03-09 DIAGNOSIS — M5416 Radiculopathy, lumbar region: Secondary | ICD-10-CM | POA: Diagnosis not present

## 2021-03-09 DIAGNOSIS — M4316 Spondylolisthesis, lumbar region: Secondary | ICD-10-CM | POA: Diagnosis not present

## 2021-03-28 ENCOUNTER — Ambulatory Visit (INDEPENDENT_AMBULATORY_CARE_PROVIDER_SITE_OTHER): Payer: Medicare HMO | Admitting: Gastroenterology

## 2021-03-28 ENCOUNTER — Other Ambulatory Visit: Payer: Self-pay

## 2021-03-28 ENCOUNTER — Encounter: Payer: Self-pay | Admitting: Gastroenterology

## 2021-03-28 ENCOUNTER — Ambulatory Visit: Payer: Medicare HMO | Admitting: Gastroenterology

## 2021-03-28 VITALS — BP 124/68 | HR 95 | Ht 66.0 in | Wt 210.0 lb

## 2021-03-28 DIAGNOSIS — Z85048 Personal history of other malignant neoplasm of rectum, rectosigmoid junction, and anus: Secondary | ICD-10-CM

## 2021-03-28 DIAGNOSIS — K5900 Constipation, unspecified: Secondary | ICD-10-CM

## 2021-03-28 DIAGNOSIS — Z86010 Personal history of colonic polyps: Secondary | ICD-10-CM

## 2021-03-28 NOTE — Progress Notes (Signed)
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 Neg CT chest/abdo/pelvis 01/2021 at [facility].  No recurrence on FS 04/2020 #2. 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 Bx-clear margins, HPV effect.  #3. Constipation #4. H/O colonic polyps 06/2016 small TAs. Last colon 12/2018 neg for polyps.  Plan: -Proceed with Flex Sig for further eval Jan 2023  -Labs from Dr Florina Ou office -Continue metamucil     HPI:    Caroline Osborne is a 75 y.o. female  With history of A. Fib (not on AC, just baby ASA), HTN, HLD, DJD back For follow-up visit. Walking with a walker due to DJD back with sciatica  Dx with anorectal squamous cell carcinoma on colonoscopy 12/2018.  Subsequent work-up including CT/MRI revealed it to be stage IIIA.  She underwent chemo radiation with good results.  Negative CT chest Abdo/pelvis 01/09/2020, 09/2020 for any recurrence.  Alt constipation and alt diarrhea. Here with constipation, pencil thin stools.  No melena or hematochezia.  No fever chills or night sweats.  She otherwise feels significantly better.  She has some lower abdominal discomfort which gets better with defecation.  Denies having any upper GI symptoms.  Recently had physical at Dr. Florina Ou office including labs.  She takes Metamucil every day.   Past GI procedures: Colonoscopy 01/02/2019 - Polypoid ulcerated lesion in the distal rectum involving the dentate line. Bx-squamous cell carcinoma.. - Mild sigmoid diverticulosis.  FS 04/2020 -One 4 mm polyp in the distal rectum, removed with a hot snare. Resected and retrieved. -Mild radiation proctitis. -Mild sigmoid diverticulosis. -Scarring was noted in the anal canal and proximal rectum without any obvious masses.  MRI pelvis 12/2018: 3.5 cm with extension through the muscularis propria.   Mesorectal lymph node measuring 5 mm.  CT chest Abdo/pelvis 12/2019 at [facility]: neg  CT AP Sept 2, 2022 at [facility] -No evidence of recurrent or metastatic carcinoma within the abdomen or pelvis. -Small hiatal hernia. -Small uterine fibroid. -Aortic Atherosclerosis (ICD10-I70.0).   Past Medical History:  Diagnosis Date   Anal cancer ([place]) 01/06/2019   Arrhythmia    Arthritis    Atrial fibrillation ([place]) 06/04/2018   Chronic back pain    Colon polyp 11/01/2020   Formatting of this note might be different from the original. States prior removal of polyp that "could have turned into cancer."   GERD (gastroesophageal reflux disease)    Hyperlipidemia    Hypertension    Hypertensive heart disease 06/04/2018   Internal hemorrhoids    OAB (overactive bladder) 11/01/2020   Primary osteoarthritis of left knee 12/11/2014   Rectal cancer ([place])    Spinal stenosis of lumbar region 11/01/2020   Spondylolisthesis 11/01/2020    Past Surgical History:  Procedure Laterality Date   BACK SURGERY  02/2016   L3/L4, L4/L5 laminotomy/decompression and fixation   CHOLECYSTECTOMY     COLON RESECTION     COLONOSCOPY  07/12/2016   Colonic polyp status post polypectomy. Mild sigmoid diverticulosis.    ESOPHAGOGASTRODUODENOSCOPY  01/21/2003   Small hiatal hernia. Mild gastroduodenitis. Small gastric polyps s/p polypectomy.    EYE SURGERY     PARTIAL KNEE ARTHROPLASTY Left    REPLACEMENT TOTAL KNEE Right 06/2018   TUBAL LIGATION      Family History  Problem Relation Age of Onset   Hypertension Mother  Breast cancer Mother    Thyroid disease Mother    Diabetes Father    Cancer Sister        all over    Hypertension Sister    Hypertension Brother    Colon cancer Neg Hx    Esophageal cancer Neg Hx    Rectal cancer Neg Hx    Stomach cancer Neg Hx     Social History   Tobacco Use   Smoking status: Never   Smokeless tobacco: Never  Vaping Use   Vaping Use: Never used  Substance Use  Topics   Alcohol use: Never   Drug use: Never    Current Outpatient Medications  Medication Sig Dispense Refill   aspirin EC 81 MG tablet Take 1 tablet (81 mg total) by mouth daily. Swallow whole. 90 tablet 3   atorvastatin (LIPITOR) 20 MG tablet Take 20 mg by mouth once a week.     calcitonin, salmon, (MIACALCIN/FORTICAL) 200 UNIT/ACT nasal spray SMARTSIG:Both Nares     lisinopril-hydrochlorothiazide (PRINZIDE,ZESTORETIC) 20-12.5 MG tablet Take 0.5 tablets by mouth daily.     Magnesium 400 MG TABS Take 1 tablet by mouth daily.     Melatonin 3 MG TABS Take 1 tablet by mouth at bedtime as needed (SLEEP).     metoprolol succinate (TOPROL-XL) 25 MG 24 hr tablet Take 0.5 tablets by mouth daily.     Multiple Vitamin (MULTIVITAMIN) tablet Take 1 tablet by mouth daily.     Omega 3 1000 MG CAPS Take 1 capsule by mouth daily.     Potassium 99 MG TABS Take 1 tablet by mouth daily.     pregabalin (LYRICA) 50 MG capsule Take by mouth.     pregabalin (LYRICA) 75 MG capsule Take 75 mg by mouth 3 (three) times daily.     VITAMIN C-VITAMIN D-ZINC PO Take 1 tablet by mouth daily.     No current facility-administered medications for this visit.    No Known Allergies  Review of Systems:  Constitutional: Denies fever, chills, diaphoresis, appetite change and fatigue.  Has OA     Physical Exam:    BP 124/68   Pulse 95   Ht 5\' 6"  (1.676 m)   Wt 210 lb (95.3 kg)   SpO2 97%   BMI 33.89 kg/m  Filed Weights   03/28/21 1118  Weight: 210 lb (95.3 kg)   Gen: awake, alert, NAD, walking with a walker HEENT: anicteric, no pallor CV: RRR, no mrg Pulm: CTA b/l Abd: soft, NT/ND, +BS throughout Rectal examination: Performed in presence of [place].  No obvious lesions.  No anal stenosis.  Stool brown heme neg Ext: no c/c/e Neuro: nonfocal   CBC: CBC Latest Ref Rng & Units 04/07/2020 12/04/2018  WBC 4.0 - 10.5 K/uL 7.2 7.5  Hemoglobin 12.0 - 15.0 g/dL 13.7 14.0  Hematocrit 36.0 - 46.0 % 42.9 42.0   Platelets 150 - 400 K/uL 237 250    CMP: CMP Latest Ref Rng & Units 04/07/2020 12/04/2018 06/04/2018  Glucose 70 - 99 mg/dL 143(H) 100(H) 92  BUN 8 - 23 mg/dL 15 10 9   Creatinine 0.44 - 1.00 mg/dL 0.78 0.65 0.74  Sodium 135 - 145 mmol/L 135 142 139  Potassium 3.5 - 5.1 mmol/L 3.3(L) 3.9 4.4  Chloride 98 - 111 mmol/L 97(L) 100 98  CO2 22 - 32 mmol/L 27 24 25   Calcium 8.9 - 10.3 mg/dL 9.8 9.9 10.0  Total Protein 6.5 - 8.1 g/dL 7.5 - -  Total Bilirubin  0.3 - 1.2 mg/dL 1.0 - -  Alkaline Phos 38 - 126 U/L 87 - -  AST 15 - 41 U/L 21 - -  ALT 0 - 44 U/L 17 - -      Carmell Austria, MD 03/28/2021, 11:23 AM  Cc: Greig Right, MD

## 2021-03-28 NOTE — Patient Instructions (Addendum)
If you are age 77 or older, your body mass index should be between 23-30. Your Body mass index is 33.89 kg/m. If this is out of the aforementioned range listed, please consider follow up with your Primary Care Provider.  If you are age 40 or younger, your body mass index should be between 19-25. Your Body mass index is 33.89 kg/m. If this is out of the aformentioned range listed, please consider follow up with your Primary Care Provider.   __________________________________________________________  The [facility] providers would like to encourage you to use [facility] to communicate with providers for non-urgent requests or questions.  Due to long hold times on the telephone, sending your provider a message by [facility] may be a faster and more efficient way to get a response.  Please allow 48 business hours for a response.  Please remember that this is for non-urgent requests.   Due to recent changes in healthcare laws, you may see the results of your imaging and laboratory studies on MyChart before your provider has had a chance to review them.  We understand that in some cases there may be results that are confusing or concerning to you. Not all laboratory results come back in the same time frame and the provider may be waiting for multiple results in order to interpret others.  Please give [place] 48 hours in order for your provider to thoroughly review all the results before contacting the office for clarification of your results.    Please purchase the following medications over the counter and take as directed:  1-Fleets enema 1-bottle of magnesium citrate  It was a pleasure to see you today!  Jackquline Denmark, M.D.

## 2021-03-31 DIAGNOSIS — M5416 Radiculopathy, lumbar region: Secondary | ICD-10-CM | POA: Diagnosis not present

## 2021-03-31 DIAGNOSIS — M4316 Spondylolisthesis, lumbar region: Secondary | ICD-10-CM | POA: Diagnosis not present

## 2021-04-13 DIAGNOSIS — M5416 Radiculopathy, lumbar region: Secondary | ICD-10-CM | POA: Diagnosis not present

## 2021-04-20 DIAGNOSIS — E7800 Pure hypercholesterolemia, unspecified: Secondary | ICD-10-CM | POA: Diagnosis not present

## 2021-04-20 DIAGNOSIS — I10 Essential (primary) hypertension: Secondary | ICD-10-CM | POA: Diagnosis not present

## 2021-04-20 DIAGNOSIS — I480 Paroxysmal atrial fibrillation: Secondary | ICD-10-CM | POA: Diagnosis not present

## 2021-04-22 DIAGNOSIS — Z1231 Encounter for screening mammogram for malignant neoplasm of breast: Secondary | ICD-10-CM | POA: Diagnosis not present

## 2021-05-04 DIAGNOSIS — M5416 Radiculopathy, lumbar region: Secondary | ICD-10-CM | POA: Diagnosis not present

## 2021-05-04 DIAGNOSIS — M4316 Spondylolisthesis, lumbar region: Secondary | ICD-10-CM | POA: Diagnosis not present

## 2021-05-05 DIAGNOSIS — R350 Frequency of micturition: Secondary | ICD-10-CM | POA: Diagnosis not present

## 2021-05-17 DIAGNOSIS — M5416 Radiculopathy, lumbar region: Secondary | ICD-10-CM | POA: Diagnosis not present

## 2021-06-01 ENCOUNTER — Telehealth: Payer: Self-pay | Admitting: Gastroenterology

## 2021-06-01 NOTE — Telephone Encounter (Signed)
Inbound call from patient stating that magnesium citrate is no longer available and is wanting to know what she can substitute for that.  Please advise.

## 2021-06-02 ENCOUNTER — Other Ambulatory Visit: Payer: Self-pay

## 2021-06-02 ENCOUNTER — Encounter: Payer: Self-pay | Admitting: Gastroenterology

## 2021-06-02 ENCOUNTER — Ambulatory Visit (AMBULATORY_SURGERY_CENTER): Payer: Medicare HMO | Admitting: Gastroenterology

## 2021-06-02 VITALS — BP 116/65 | HR 83 | Temp 97.8°F | Resp 17 | Ht 66.0 in | Wt 210.0 lb

## 2021-06-02 DIAGNOSIS — K627 Radiation proctitis: Secondary | ICD-10-CM | POA: Diagnosis not present

## 2021-06-02 DIAGNOSIS — K5730 Diverticulosis of large intestine without perforation or abscess without bleeding: Secondary | ICD-10-CM

## 2021-06-02 DIAGNOSIS — Z85048 Personal history of other malignant neoplasm of rectum, rectosigmoid junction, and anus: Secondary | ICD-10-CM | POA: Diagnosis not present

## 2021-06-02 MED ORDER — SODIUM CHLORIDE 0.9 % IV SOLN: 500.0000 mL | Freq: Once | INTRAVENOUS | Status: DC

## 2021-06-02 NOTE — Op Note (Signed)
[facility] Patient Name: Caroline Osborne Procedure Date: 06/02/2021 10:06 AM MRN: [id number] Endoscopist: Jackquline Denmark , MD Age: 76 Referring MD:  Date of Birth: 09-29-1945 Gender: Female Account #: [id number] Procedure:                Flexible Sigmoidoscopy Indications:              Anorectal sq cell Ca 12/2018 Stage IIIA (T2N1M0) s/p                            ChemoXRT (mitomycin-C/5FU). Neg CT                            chest/abdo/pelvis 01/2021 at [facility]. No recurrence on FS                            04/2020 Medicines:                Monitored Anesthesia Care Procedure:                Pre-Anesthesia Assessment:                           - Prior to the procedure, a History and Physical                            was performed, and patient medications and                            allergies were reviewed. The patient's tolerance of                            previous anesthesia was also reviewed. The risks                            and benefits of the procedure and the sedation                            options and risks were discussed with the patient.                            All questions were answered, and informed consent                            was obtained. Prior Anticoagulants: The patient has                            taken no previous anticoagulant or antiplatelet                            agents. ASA Grade Assessment: III - A patient with                            severe systemic disease. After reviewing the risks  and benefits, the patient was deemed in                            satisfactory condition to undergo the procedure.                           After obtaining informed consent, the scope was                            passed under direct vision. The Olympus [id number]-[id number]                            (#[id number]) Colonoscope was introduced through the                            anus and advanced to the the splenic flexure (60                             cm). The flexible sigmoidoscopy was accomplished                            without difficulty. The patient tolerated the                            procedure well. The quality of the bowel                            preparation was good. Scope In: 10:15:47 AM Scope Out: 10:21:21 AM Total Procedure Duration: 0 hours 5 minutes 34 seconds  Findings:                 - The perianal and digital rectal examinations                            revealed a posterior scar and a small                           thrombosed internal hemorrhoid. There were no                            masses palpated.                           Findings:                           - Multiple small patchy angioectasias without                            bleeding were found in the rectum s/o mild                           radiation proctitis.                           - A few small-mouthed diverticula were found in  the                            sigmoid colon. No Recurrence. Complications:            No immediate complications. Estimated Blood Loss:     Estimated blood loss: none. Impression:               -Mild radiation proctitis.                           -Mild sigmoid diverticulosis.                           -The examination was otherwise normal.                           -No specimens collected. Recommendation:           - Discharge patient to home.                           - Resume previous diet.                           - Continue present medications.                           - Repeat flexible sigmoidoscopy in 1 year for                            surveillance. Earlier if with any problems. Call if                            any problems. Jackquline Denmark, MD 06/02/2021 10:31:46 AM This report has been signed electronically.

## 2021-06-02 NOTE — Telephone Encounter (Signed)
Patient called stating she was unable to purchase mag citrate. Provided up-dated instructions with Miralax.

## 2021-06-02 NOTE — Patient Instructions (Signed)
HANDOUTS PROVIDED ON: DIVERTICULOSIS   Repeat sigmoidoscopy is recommended in 1 year.   You may resume your previous diet and medication schedule.  Thank you for allowing [place] to care for you today!!!   YOU HAD AN ENDOSCOPIC PROCEDURE TODAY AT [facility]:   Refer to the procedure report that was given to you for any specific questions about what was found during the examination.  If the procedure report does not answer your questions, please call your gastroenterologist to clarify.  If you requested that your care partner not be given the details of your procedure findings, then the procedure report has been included in a sealed envelope for you to review at your convenience later.  YOU SHOULD EXPECT: Some feelings of bloating in the abdomen. Passage of more gas than usual.  Walking can help get rid of the air that was put into your GI tract during the procedure and reduce the bloating. If you had a lower endoscopy (such as a colonoscopy or flexible sigmoidoscopy) you may notice spotting of blood in your stool or on the toilet paper. If you underwent a bowel prep for your procedure, you may not have a normal bowel movement for a few days.  Please Note:  You might notice some irritation and congestion in your nose or some drainage.  This is from the oxygen used during your procedure.  There is no need for concern and it should clear up in a day or so.  SYMPTOMS TO REPORT IMMEDIATELY:  Following lower endoscopy (colonoscopy or flexible sigmoidoscopy):  Excessive amounts of blood in the stool  Significant tenderness or worsening of abdominal pains  Swelling of the abdomen that is new, acute  Fever of 100F or higher  For urgent or emergent issues, a gastroenterologist can be reached at any hour by calling [phone]. Do not use MyChart messaging for urgent concerns.    DIET:  We do recommend a small meal at first, but then you may proceed to your regular diet.  Drink  plenty of fluids but you should avoid alcoholic beverages for 24 hours.  ACTIVITY:  You should plan to take it easy for the rest of today and you should NOT DRIVE or use heavy machinery until tomorrow (because of the sedation medicines used during the test).    FOLLOW UP: Our staff will call the number listed on your records Monday between 7:15 am and 8:15 am following your procedure to check on you and address any questions or concerns that you may have regarding the information given to you following your procedure. If we do not reach you, we will leave a message.  We will attempt to reach you two times.  During this call, we will ask if you have developed any symptoms of COVID 19. If you develop any symptoms (ie: fever, flu-like symptoms, shortness of breath, cough etc.) before then, please call [phone].  If you test positive for Covid 19 in the 2 weeks post procedure, please call and report this information to [place].    If any biopsies were taken you will be contacted by phone or by letter within the next 1-3 weeks.  Please call us at [phone] if you have not heard about the biopsies in 3 weeks.    SIGNATURES/CONFIDENTIALITY: You and/or your care partner have signed paperwork which will be entered into your electronic medical record.  These signatures attest to the fact that that the information above on your After Visit Summary  has been reviewed and is understood.  Full responsibility of the confidentiality of this discharge information lies with you and/or your care-partner.

## 2021-06-02 NOTE — Progress Notes (Signed)
Sedate, gd SR, tolerated procedure well, VSS, report to RN 

## 2021-06-02 NOTE — Progress Notes (Signed)
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 Neg CT chest/abdo/pelvis 01/2021 at [facility].  No recurrence on FS 04/2020 #2. 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 Bx-clear margins, HPV effect.  #3. Constipation #4. H/O colonic polyps 06/2016 small TAs. Last colon 12/2018 neg for polyps.  Plan: -Proceed with Flex Sig for further eval Jan 2023  -Labs from Dr Florina Ou office -Continue metamucil     HPI:    Caroline Osborne is a 76 y.o. female  With history of A. Fib (not on AC, just baby ASA), HTN, HLD, DJD back For follow-up visit. Walking with a walker due to DJD back with sciatica  Dx with anorectal squamous cell carcinoma on colonoscopy 12/2018.  Subsequent work-up including CT/MRI revealed it to be stage IIIA.  She underwent chemo radiation with good results.  Negative CT chest Abdo/pelvis 01/09/2020, 09/2020 for any recurrence.  Alt constipation and alt diarrhea. Here with constipation, pencil thin stools.  No melena or hematochezia.  No fever chills or night sweats.  She otherwise feels significantly better.  She has some lower abdominal discomfort which gets better with defecation.  Denies having any upper GI symptoms.  Recently had physical at Dr. Florina Ou office including labs.  She takes Metamucil every day.   Past GI procedures: Colonoscopy 01/02/2019 - Polypoid ulcerated lesion in the distal rectum involving the dentate line. Bx-squamous cell carcinoma.. - Mild sigmoid diverticulosis.  FS 04/2020 -One 4 mm polyp in the distal rectum, removed with a hot snare. Resected and retrieved. -Mild radiation proctitis. -Mild sigmoid diverticulosis. -Scarring was noted in the anal canal and proximal rectum without any obvious masses.  MRI pelvis 12/2018: 3.5 cm with extension through the muscularis propria.   Mesorectal lymph node measuring 5 mm.  CT chest Abdo/pelvis 12/2019 at [facility]: neg  CT AP Sept 2, 2022 at [facility] -No evidence of recurrent or metastatic carcinoma within the abdomen or pelvis. -Small hiatal hernia. -Small uterine fibroid. -Aortic Atherosclerosis (ICD10-I70.0).   Past Medical History:  Diagnosis Date   Anal cancer ([place]) 01/06/2019   Arrhythmia    Arthritis    Atrial fibrillation ([place]) 06/04/2018   Chronic back pain    Colon polyp 11/01/2020   Formatting of this note might be different from the original. States prior removal of polyp that "could have turned into cancer."   GERD (gastroesophageal reflux disease)    Hyperlipidemia    Hypertension    Hypertensive heart disease 06/04/2018   Internal hemorrhoids    OAB (overactive bladder) 11/01/2020   Primary osteoarthritis of left knee 12/11/2014   Rectal cancer ([place])    Spinal stenosis of lumbar region 11/01/2020   Spondylolisthesis 11/01/2020    Past Surgical History:  Procedure Laterality Date   BACK SURGERY  02/2016   L3/L4, L4/L5 laminotomy/decompression and fixation   CHOLECYSTECTOMY     COLON RESECTION     COLONOSCOPY  07/12/2016   Colonic polyp status post polypectomy. Mild sigmoid diverticulosis.    ESOPHAGOGASTRODUODENOSCOPY  01/21/2003   Small hiatal hernia. Mild gastroduodenitis. Small gastric polyps s/p polypectomy.    EYE SURGERY     PARTIAL KNEE ARTHROPLASTY Left    REPLACEMENT TOTAL KNEE Right 06/2018   TUBAL LIGATION      Family History  Problem Relation Age of Onset   Hypertension Mother  Breast cancer Mother    Thyroid disease Mother    Diabetes Father    Cancer Sister        all over    Hypertension Sister    Hypertension Brother    Colon cancer Neg Hx    Esophageal cancer Neg Hx    Rectal cancer Neg Hx    Stomach cancer Neg Hx     Social History   Tobacco Use   Smoking status: Never   Smokeless tobacco: Never  Vaping Use   Vaping Use: Never used  Substance Use  Topics   Alcohol use: Never   Drug use: Never    Current Outpatient Medications  Medication Sig Dispense Refill   aspirin EC 81 MG tablet Take 1 tablet (81 mg total) by mouth daily. Swallow whole. 90 tablet 3   atorvastatin (LIPITOR) 20 MG tablet Take 20 mg by mouth once a week.     calcitonin, salmon, (MIACALCIN/FORTICAL) 200 UNIT/ACT nasal spray SMARTSIG:Both Nares     lisinopril-hydrochlorothiazide (PRINZIDE,ZESTORETIC) 20-12.5 MG tablet Take 0.5 tablets by mouth daily.     metoprolol succinate (TOPROL-XL) 25 MG 24 hr tablet Take 0.5 tablets by mouth daily.     pregabalin (LYRICA) 50 MG capsule Take by mouth.     pregabalin (LYRICA) 75 MG capsule Take 75 mg by mouth 3 (three) times daily.     VITAMIN C-VITAMIN D-ZINC PO Take 1 tablet by mouth daily.     Magnesium 400 MG TABS Take 1 tablet by mouth daily. (Patient not taking: Reported on 06/02/2021)     Melatonin 3 MG TABS Take 1 tablet by mouth at bedtime as needed (SLEEP). (Patient not taking: Reported on 06/02/2021)     Multiple Vitamin (MULTIVITAMIN) tablet Take 1 tablet by mouth daily. (Patient not taking: Reported on 06/02/2021)     Omega 3 1000 MG CAPS Take 1 capsule by mouth daily. (Patient not taking: Reported on 06/02/2021)     Potassium 99 MG TABS Take 1 tablet by mouth daily. (Patient not taking: Reported on 06/02/2021)     Current Facility-Administered Medications  Medication Dose Route Frequency Provider Last Rate Last Admin   0.9 %  sodium chloride infusion  500 mL Intravenous Once Jackquline Denmark, MD        No Known Allergies  Review of Systems:  Constitutional: Denies fever, chills, diaphoresis, appetite change and fatigue.  Has OA     Physical Exam:    BP (!) 143/66    Pulse 88    Temp 97.8 F (36.6 C)    Ht 5\' 6"  (1.676 m)    Wt 210 lb (95.3 kg)    SpO2 98%    BMI 33.89 kg/m  Filed Weights   06/02/21 0916  Weight: 210 lb (95.3 kg)   Gen: awake, alert, NAD, walking with a walker HEENT: anicteric, no  pallor CV: RRR, no mrg Pulm: CTA b/l Abd: soft, NT/ND, +BS throughout Rectal examination: Performed in presence of [place].  No obvious lesions.  No anal stenosis.  Stool brown heme neg Ext: no c/c/e Neuro: nonfocal   CBC: CBC Latest Ref Rng & Units 04/07/2020 12/04/2018  WBC 4.0 - 10.5 K/uL 7.2 7.5  Hemoglobin 12.0 - 15.0 g/dL 13.7 14.0  Hematocrit 36.0 - 46.0 % 42.9 42.0  Platelets 150 - 400 K/uL 237 250    CMP: CMP Latest Ref Rng & Units 04/07/2020 12/04/2018 06/04/2018  Glucose 70 - 99 mg/dL 143(H) 100(H) 92  BUN 8 - 23 mg/dL  15 10 9   Creatinine 0.44 - 1.00 mg/dL 0.78 0.65 0.74  Sodium 135 - 145 mmol/L 135 142 139  Potassium 3.5 - 5.1 mmol/L 3.3(L) 3.9 4.4  Chloride 98 - 111 mmol/L 97(L) 100 98  CO2 22 - 32 mmol/L 27 24 25   Calcium 8.9 - 10.3 mg/dL 9.8 9.9 10.0  Total Protein 6.5 - 8.1 g/dL 7.5 - -  Total Bilirubin 0.3 - 1.2 mg/dL 1.0 - -  Alkaline Phos 38 - 126 U/L 87 - -  AST 15 - 41 U/L 21 - -  ALT 0 - 44 U/L 17 - -      Carmell Austria, MD 06/02/2021, 10:10 AM  Cc: Greig Right, MD

## 2021-06-06 ENCOUNTER — Telehealth: Payer: Self-pay

## 2021-06-06 ENCOUNTER — Telehealth: Payer: Self-pay | Admitting: *Deleted

## 2021-06-06 NOTE — Telephone Encounter (Signed)
°  Follow up Call-  Call back number 06/02/2021 04/26/2020 01/02/2019  Post procedure Call Back phone  # [phone]  Permission to leave phone message Yes Yes Yes  Some recent data might be hidden     Patient questions:  Do you have a fever, pain , or abdominal swelling? No. Pain Score  0 *  Have you tolerated food without any problems? Yes.    Have you been able to return to your normal activities? Yes.    Do you have any questions about your discharge instructions: Diet   No. Medications  No. Follow up visit  No.  Do you have questions or concerns about your Care? No.  Actions: * If pain score is 4 or above: No action needed, pain <4.

## 2021-06-06 NOTE — Telephone Encounter (Signed)
Left message on f/u call 

## 2021-06-07 DIAGNOSIS — M5416 Radiculopathy, lumbar region: Secondary | ICD-10-CM | POA: Diagnosis not present

## 2021-06-07 DIAGNOSIS — M4316 Spondylolisthesis, lumbar region: Secondary | ICD-10-CM | POA: Diagnosis not present

## 2021-06-19 DIAGNOSIS — I480 Paroxysmal atrial fibrillation: Secondary | ICD-10-CM | POA: Diagnosis not present

## 2021-06-19 DIAGNOSIS — E7800 Pure hypercholesterolemia, unspecified: Secondary | ICD-10-CM | POA: Diagnosis not present

## 2021-06-19 DIAGNOSIS — I10 Essential (primary) hypertension: Secondary | ICD-10-CM | POA: Diagnosis not present

## 2021-07-19 DIAGNOSIS — E7800 Pure hypercholesterolemia, unspecified: Secondary | ICD-10-CM | POA: Diagnosis not present

## 2021-07-19 DIAGNOSIS — M545 Low back pain, unspecified: Secondary | ICD-10-CM | POA: Diagnosis not present

## 2021-07-19 DIAGNOSIS — I480 Paroxysmal atrial fibrillation: Secondary | ICD-10-CM | POA: Diagnosis not present

## 2021-10-19 DIAGNOSIS — I10 Essential (primary) hypertension: Secondary | ICD-10-CM | POA: Diagnosis not present

## 2021-10-19 DIAGNOSIS — I480 Paroxysmal atrial fibrillation: Secondary | ICD-10-CM | POA: Diagnosis not present

## 2021-10-19 DIAGNOSIS — E7800 Pure hypercholesterolemia, unspecified: Secondary | ICD-10-CM | POA: Diagnosis not present

## 2021-11-18 DIAGNOSIS — Z961 Presence of intraocular lens: Secondary | ICD-10-CM | POA: Diagnosis not present

## 2021-12-05 DIAGNOSIS — E7800 Pure hypercholesterolemia, unspecified: Secondary | ICD-10-CM | POA: Diagnosis not present

## 2021-12-05 DIAGNOSIS — I10 Essential (primary) hypertension: Secondary | ICD-10-CM | POA: Diagnosis not present

## 2021-12-05 DIAGNOSIS — Z6835 Body mass index (BMI) 35.0-35.9, adult: Secondary | ICD-10-CM | POA: Diagnosis not present

## 2021-12-05 DIAGNOSIS — E669 Obesity, unspecified: Secondary | ICD-10-CM | POA: Diagnosis not present

## 2021-12-05 DIAGNOSIS — M545 Low back pain, unspecified: Secondary | ICD-10-CM | POA: Diagnosis not present

## 2022-02-09 DIAGNOSIS — E7800 Pure hypercholesterolemia, unspecified: Secondary | ICD-10-CM | POA: Diagnosis not present

## 2022-02-09 DIAGNOSIS — I700 Atherosclerosis of aorta: Secondary | ICD-10-CM | POA: Diagnosis not present

## 2022-02-09 DIAGNOSIS — I10 Essential (primary) hypertension: Secondary | ICD-10-CM | POA: Diagnosis not present

## 2022-02-09 DIAGNOSIS — I4891 Unspecified atrial fibrillation: Secondary | ICD-10-CM | POA: Diagnosis not present

## 2022-02-09 DIAGNOSIS — Z23 Encounter for immunization: Secondary | ICD-10-CM | POA: Diagnosis not present

## 2022-02-09 DIAGNOSIS — E669 Obesity, unspecified: Secondary | ICD-10-CM | POA: Diagnosis not present

## 2022-02-09 DIAGNOSIS — Z6835 Body mass index (BMI) 35.0-35.9, adult: Secondary | ICD-10-CM | POA: Diagnosis not present

## 2022-02-09 DIAGNOSIS — Z0000 Encounter for general adult medical examination without abnormal findings: Secondary | ICD-10-CM | POA: Diagnosis not present

## 2022-02-09 DIAGNOSIS — Z85048 Personal history of other malignant neoplasm of rectum, rectosigmoid junction, and anus: Secondary | ICD-10-CM | POA: Diagnosis not present

## 2022-02-16 DIAGNOSIS — R051 Acute cough: Secondary | ICD-10-CM | POA: Diagnosis not present

## 2022-02-18 DIAGNOSIS — I480 Paroxysmal atrial fibrillation: Secondary | ICD-10-CM | POA: Diagnosis not present

## 2022-02-18 DIAGNOSIS — E7800 Pure hypercholesterolemia, unspecified: Secondary | ICD-10-CM | POA: Diagnosis not present

## 2022-04-24 DIAGNOSIS — Z1231 Encounter for screening mammogram for malignant neoplasm of breast: Secondary | ICD-10-CM | POA: Diagnosis not present

## 2022-06-08 DIAGNOSIS — G729 Myopathy, unspecified: Secondary | ICD-10-CM | POA: Diagnosis not present

## 2022-06-08 DIAGNOSIS — I10 Essential (primary) hypertension: Secondary | ICD-10-CM | POA: Diagnosis not present

## 2022-06-08 DIAGNOSIS — E7800 Pure hypercholesterolemia, unspecified: Secondary | ICD-10-CM | POA: Diagnosis not present

## 2022-06-08 DIAGNOSIS — Z6833 Body mass index (BMI) 33.0-33.9, adult: Secondary | ICD-10-CM | POA: Diagnosis not present

## 2022-06-08 DIAGNOSIS — E669 Obesity, unspecified: Secondary | ICD-10-CM | POA: Diagnosis not present

## 2022-06-08 DIAGNOSIS — D492 Neoplasm of unspecified behavior of bone, soft tissue, and skin: Secondary | ICD-10-CM | POA: Diagnosis not present

## 2022-06-08 DIAGNOSIS — M545 Low back pain, unspecified: Secondary | ICD-10-CM | POA: Diagnosis not present

## 2022-07-14 ENCOUNTER — Encounter: Payer: Self-pay | Admitting: Gastroenterology

## 2022-07-24 ENCOUNTER — Encounter: Payer: Self-pay | Admitting: Gastroenterology

## 2022-08-02 DIAGNOSIS — S81802A Unspecified open wound, left lower leg, initial encounter: Secondary | ICD-10-CM | POA: Diagnosis not present

## 2022-08-28 DIAGNOSIS — H9192 Unspecified hearing loss, left ear: Secondary | ICD-10-CM | POA: Diagnosis not present

## 2022-08-28 DIAGNOSIS — H903 Sensorineural hearing loss, bilateral: Secondary | ICD-10-CM | POA: Diagnosis not present

## 2022-08-28 DIAGNOSIS — H8110 Benign paroxysmal vertigo, unspecified ear: Secondary | ICD-10-CM | POA: Diagnosis not present

## 2022-08-28 DIAGNOSIS — R42 Dizziness and giddiness: Secondary | ICD-10-CM | POA: Diagnosis not present

## 2022-08-29 ENCOUNTER — Ambulatory Visit (INDEPENDENT_AMBULATORY_CARE_PROVIDER_SITE_OTHER)
Admission: RE | Admit: 2022-08-29 | Discharge: 2022-08-29 | Disposition: A | Discharge status: Home / Self Care | Payer: Medicare HMO | Source: Ambulatory Visit | Attending: Gastroenterology | Admitting: Gastroenterology

## 2022-08-29 ENCOUNTER — Encounter: Payer: Self-pay | Admitting: Gastroenterology

## 2022-08-29 ENCOUNTER — Ambulatory Visit: Payer: Medicare HMO | Admitting: Gastroenterology

## 2022-08-29 VITALS — BP 124/70 | HR 75 | Ht 64.0 in | Wt 192.4 lb

## 2022-08-29 DIAGNOSIS — Z860100 Personal history of colon polyps, unspecified: Secondary | ICD-10-CM | POA: Insufficient documentation

## 2022-08-29 DIAGNOSIS — C210 Malignant neoplasm of anus, unspecified: Secondary | ICD-10-CM

## 2022-08-29 DIAGNOSIS — K5900 Constipation, unspecified: Secondary | ICD-10-CM | POA: Diagnosis not present

## 2022-08-29 DIAGNOSIS — C218 Malignant neoplasm of overlapping sites of rectum, anus and anal canal: Secondary | ICD-10-CM | POA: Diagnosis not present

## 2022-08-29 HISTORY — DX: Personal history of colon polyps, unspecified: Z86.0100

## 2022-08-29 NOTE — Patient Instructions (Addendum)
Your provider has requested that you have an abdominal x ray before leaving today. Please go to the basement floor to our Radiology department for the test.   Please take Miralax 17 g ( 1 Capful ) two times daily until a large bowel Movement, then take one time daily.  You have been scheduled for Flex Sig. Please follow written instructions given to you at your visit today. If you use inhalers (even only as needed), please bring them with you on the day of your procedure.   _______________________________________________________  If your blood pressure at your visit was 140/90 or greater, please contact your primary care physician to follow up on this.  _______________________________________________________  If you are age 42 or older, your body mass index should be between 23-30. Your Body mass index is 33.03 kg/m. If this is out of the aforementioned range listed, please consider follow up with your Primary Care Provider.   __________________________________________________________  The [facility] providers would like to encourage you to use [facility] to communicate with providers for non-urgent requests or questions.  Due to long hold times on the telephone, sending your provider a message by [facility] may be a faster and more efficient way to get a response.  Please allow 48 business hours for a response.  Please remember that this is for non-urgent requests.    Thank you for choosing me and [facility].  Vito Cirigliano, D.O.

## 2022-08-29 NOTE — Progress Notes (Signed)
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 Neg CT chest/abdo/pelvis 01/2021 at [facility].  No recurrence on FS 05/2021 #2. 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 Bx-clear margins, HPV effect.  #3. Constipation #4. H/O colonic polyps 06/2016 small TAs. Last colon 12/2018 neg for polyps.  Plan: -Proceed with Flex Sig -X ray KUB 2V today -Miralax 17g po BID until a large BM, then QD     HPI:    Caroline Osborne is a 77 y.o. female  With history of A. Fib (not on AC, just baby ASA), HTN, HLD, DJD back For follow-up visit. Came back from a trip Had more problems with constipation Denies having any rectal bleeding but occasional rectal discomfort  Rectal exam did reveal softer stool in the rectal vault.  Heme-negative (in presence of Nabina).  No masses.  Dx with anorectal squamous cell carcinoma on colonoscopy 12/2018.  Subsequent work-up including CT/MRI revealed it to be stage IIIA.  She underwent chemo radiation with good results.  Negative CT chest Abdo/pelvis 01/09/2020, 09/2020 for any recurrence.  Had negative flexible sigmoidoscopy January 2023  Alt constipation and alt diarrhea. Here with constipation, pencil thin stools.  No melena or hematochezia.  No fever chills or night sweats.  She otherwise feels significantly better.  She has some lower abdominal discomfort which gets better with defecation.  Denies having any upper GI symptoms. Stopped taking Metamucil on her own.   Past GI procedures: Colonoscopy 01/02/2019 - Polypoid ulcerated lesion in the distal rectum involving the dentate line. Bx-squamous cell carcinoma.. - Mild sigmoid diverticulosis.  FS 04/2020 -One 4 mm polyp in the distal rectum, removed with a hot snare. Resected and retrieved. -Mild radiation proctitis. -Mild sigmoid  diverticulosis. -Scarring was noted in the anal canal and proximal rectum without any obvious masses.  FS 06/02/2021: neg  MRI pelvis 12/2018: 3.5 cm with extension through the muscularis propria.  Mesorectal lymph node measuring 5 mm.  CT chest Abdo/pelvis 12/2019 at [facility]: neg  CT AP Sept 2, 2022 at [facility] -No evidence of recurrent or metastatic carcinoma within the abdomen or pelvis. -Small hiatal hernia. -Small uterine fibroid. -Aortic Atherosclerosis (ICD10-I70.0).   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 States prior removal of polyp that "could have turned into cancer."   GERD (gastroesophageal reflux disease)    Hyperlipidemia    Hypertension    Hypertensive heart disease 06/04/2018   Internal hemorrhoids    OAB (overactive bladder) 11/01/2020   Primary osteoarthritis of left knee 12/11/2014   Rectal cancer    Spinal stenosis of lumbar region 11/01/2020   Spondylolisthesis 11/01/2020    Past Surgical History:  Procedure Laterality Date   BACK SURGERY  02/2016   L3/L4, L4/L5 laminotomy/decompression and fixation   CHOLECYSTECTOMY     COLON RESECTION     COLONOSCOPY  07/12/2016   Colonic polyp status post polypectomy. Mild sigmoid diverticulosis.    ESOPHAGOGASTRODUODENOSCOPY  01/21/2003   Small hiatal hernia. Mild gastroduodenitis. Small gastric polyps s/p polypectomy.    EYE SURGERY     PARTIAL KNEE ARTHROPLASTY Left  REPLACEMENT TOTAL KNEE Right 06/2018   TUBAL LIGATION      Family History  Problem Relation Age of Onset   Hypertension Mother    Breast cancer Mother    Thyroid disease Mother    Diabetes Father    Cancer Sister        all over    Hypertension Sister    Hypertension Brother    Colon cancer Neg Hx    Esophageal cancer Neg Hx    Rectal cancer  Neg Hx    Stomach cancer Neg Hx     Social History   Tobacco Use   Smoking status: Never   Smokeless tobacco: Never  Vaping Use   Vaping Use: Never used  Substance Use Topics   Alcohol use: Never   Drug use: Never    Current Outpatient Medications  Medication Sig Dispense Refill   aspirin EC 81 MG tablet Take 1 tablet (81 mg total) by mouth daily. Swallow whole. 90 tablet 3   atorvastatin (LIPITOR) 20 MG tablet Take 20 mg by mouth once a week.     calcitonin, salmon, (MIACALCIN/FORTICAL) 200 UNIT/ACT nasal spray SMARTSIG:Both Nares     ezetimibe (ZETIA) 10 MG tablet Take 10 mg by mouth as needed.     lisinopril-hydrochlorothiazide (PRINZIDE,ZESTORETIC) 20-12.5 MG tablet Take 0.5 tablets by mouth daily.     Melatonin 3 MG TABS Take 1 tablet by mouth at bedtime as needed (SLEEP).     metoprolol succinate (TOPROL-XL) 25 MG 24 hr tablet Take 0.5 tablets by mouth daily.     Multiple Vitamin (MULTIVITAMIN) tablet Take 1 tablet by mouth daily.     Omega 3 1000 MG CAPS Take 1 capsule by mouth daily.     Potassium 99 MG TABS Take 1 tablet by mouth daily.     pregabalin (LYRICA) 50 MG capsule Take by mouth.     pregabalin (LYRICA) 75 MG capsule Take 75 mg by mouth 3 (three) times daily.     VITAMIN C-VITAMIN D-ZINC PO Take 1 tablet by mouth daily.     Magnesium 400 MG TABS Take 1 tablet by mouth daily. (Patient not taking: Reported on 06/02/2021)     No current facility-administered medications for this visit.    No Known Allergies  Review of Systems:  Constitutional: Denies fever, chills, diaphoresis, appetite change and fatigue.  Has OA     Physical Exam:    BP 124/70   Pulse 75   Ht 5\' 4"  (1.626 m)   Wt 192 lb 6.4 oz (87.3 kg)   SpO2 96%   BMI 33.03 kg/m  Filed Weights   08/29/22 1110  Weight: 192 lb 6.4 oz (87.3 kg)   Gen: awake, alert, NAD, walking with a walker HEENT: anicteric, no pallor CV: RRR, no mrg Pulm: CTA b/l Abd: soft, NT/ND, +BS throughout Rectal  examination: Performed in presence of Nabina.  No obvious lesions.  No anal stenosis.  Significant stool in the anal vault without obstruction.  Soft stool.  Stool brown heme neg Ext: no c/c/e Neuro: nonfocal   CBC:    Latest Ref Rng & Units 04/07/2020    2:09 PM 12/04/2018   11:11 AM  CBC  WBC 4.0 - 10.5 K/uL 7.2  7.5   Hemoglobin 12.0 - 15.0 g/dL 74.0  81.4   Hematocrit 36.0 - 46.0 % 42.9  42.0   Platelets 150 - 400 K/uL 237  250     CMP:    Latest Ref Rng &  Units 04/07/2020    2:09 PM 12/04/2018   11:11 AM 06/04/2018    8:52 AM  CMP  Glucose 70 - 99 mg/dL 161143  096100  92   BUN 8 - 23 mg/dL 15  10  9    Creatinine 0.44 - 1.00 mg/dL 0.450.78  4.090.65  8.110.74   Sodium 135 - 145 mmol/L 135  142  139   Potassium 3.5 - 5.1 mmol/L 3.3  3.9  4.4   Chloride 98 - 111 mmol/L 97  100  98   CO2 22 - 32 mmol/L 27  24  25    Calcium 8.9 - 10.3 mg/dL 9.8  9.9  91.410.0   Total Protein 6.5 - 8.1 g/dL 7.5     Total Bilirubin 0.3 - 1.2 mg/dL 1.0     Alkaline Phos 38 - 126 U/L 87     AST 15 - 41 U/L 21     ALT 0 - 44 U/L 17         Edman Circleaj Onis Markoff, MD 08/29/2022, 11:31 AM  Cc: Alinda DeemPenner, Pamela, MD

## 2022-09-14 ENCOUNTER — Ambulatory Visit (AMBULATORY_SURGERY_CENTER): Payer: Medicare HMO | Admitting: Gastroenterology

## 2022-09-14 ENCOUNTER — Encounter: Payer: Self-pay | Admitting: Gastroenterology

## 2022-09-14 VITALS — BP 117/64 | HR 76 | Temp 97.3°F | Resp 17 | Ht 64.0 in | Wt 192.0 lb

## 2022-09-14 DIAGNOSIS — Z85040 Personal history of malignant carcinoid tumor of rectum: Secondary | ICD-10-CM

## 2022-09-14 DIAGNOSIS — Z08 Encounter for follow-up examination after completed treatment for malignant neoplasm: Secondary | ICD-10-CM

## 2022-09-14 DIAGNOSIS — Z85048 Personal history of other malignant neoplasm of rectum, rectosigmoid junction, and anus: Secondary | ICD-10-CM | POA: Diagnosis not present

## 2022-09-14 DIAGNOSIS — Z86010 Personal history of colonic polyps: Secondary | ICD-10-CM | POA: Diagnosis not present

## 2022-09-14 DIAGNOSIS — Z09 Encounter for follow-up examination after completed treatment for conditions other than malignant neoplasm: Secondary | ICD-10-CM | POA: Diagnosis not present

## 2022-09-14 DIAGNOSIS — K5900 Constipation, unspecified: Secondary | ICD-10-CM

## 2022-09-14 MED ORDER — SODIUM CHLORIDE 0.9 % IV SOLN
500.0000 mL | INTRAVENOUS | Status: DC
Start: 2022-09-14 — End: 2022-09-14

## 2022-09-14 NOTE — Progress Notes (Signed)
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 Neg CT chest/abdo/pelvis 01/2021 at [facility].  No recurrence on FS 05/2021 #2. 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 Bx-clear margins, HPV effect.  #3. Constipation #4. H/O colonic polyps 06/2016 small TAs. Last colon 12/2018 neg for polyps.  Plan: -Proceed with Flex Sig -X ray KUB 2V today -Miralax 17g po BID until a large BM, then QD     HPI:    Caroline Osborne is a 77 y.o. female  With history of A. Fib (not on AC, just baby ASA), HTN, HLD, DJD back For follow-up visit. Came back from a trip Had more problems with constipation Denies having any rectal bleeding but occasional rectal discomfort  Rectal exam did reveal softer stool in the rectal vault.  Heme-negative (in presence of Nabina).  No masses.  Dx with anorectal squamous cell carcinoma on colonoscopy 12/2018.  Subsequent work-up including CT/MRI revealed it to be stage IIIA.  She underwent chemo radiation with good results.  Negative CT chest Abdo/pelvis 01/09/2020, 09/2020 for any recurrence.  Had negative flexible sigmoidoscopy January 2023  Alt constipation and alt diarrhea. Here with constipation, pencil thin stools.  No melena or hematochezia.  No fever chills or night sweats.  She otherwise feels significantly better.  She has some lower abdominal discomfort which gets better with defecation.  Denies having any upper GI symptoms. Stopped taking Metamucil on her own.   Past GI procedures: Colonoscopy 01/02/2019 - Polypoid ulcerated lesion in the distal rectum involving the dentate line. Bx-squamous cell carcinoma.. - Mild sigmoid diverticulosis.  FS 04/2020 -One 4 mm polyp in the distal rectum, removed with a hot snare. Resected and retrieved. -Mild radiation proctitis. -Mild sigmoid  diverticulosis. -Scarring was noted in the anal canal and proximal rectum without any obvious masses.  FS 06/02/2021: neg  MRI pelvis 12/2018: 3.5 cm with extension through the muscularis propria.  Mesorectal lymph node measuring 5 mm.  CT chest Abdo/pelvis 12/2019 at [facility]: neg  CT AP Sept 2, 2022 at [facility] -No evidence of recurrent or metastatic carcinoma within the abdomen or pelvis. -Small hiatal hernia. -Small uterine fibroid. -Aortic Atherosclerosis (ICD10-I70.0).   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 States prior removal of polyp that "could have turned into cancer."   GERD (gastroesophageal reflux disease)    Hyperlipidemia    Hypertension    Hypertensive heart disease 06/04/2018   Internal hemorrhoids    OAB (overactive bladder) 11/01/2020   Primary osteoarthritis of left knee 12/11/2014   Rectal cancer    Spinal stenosis of lumbar region 11/01/2020   Spondylolisthesis 11/01/2020    Past Surgical History:  Procedure Laterality Date   BACK SURGERY  02/2016   L3/L4, L4/L5 laminotomy/decompression and fixation   CHOLECYSTECTOMY     COLON RESECTION     COLONOSCOPY  07/12/2016   Colonic polyp status post polypectomy. Mild sigmoid diverticulosis.    ESOPHAGOGASTRODUODENOSCOPY  01/21/2003   Small hiatal hernia. Mild gastroduodenitis. Small gastric polyps s/p polypectomy.    EYE SURGERY     PARTIAL KNEE ARTHROPLASTY Left  REPLACEMENT TOTAL KNEE Right 06/2018   TUBAL LIGATION      Family History  Problem Relation Age of Onset   Hypertension Mother    Breast cancer Mother    Thyroid disease Mother    Diabetes Father    Cancer Sister        all over    Hypertension Sister    Hypertension Brother    Colon cancer Neg Hx    Esophageal cancer Neg Hx    Rectal cancer  Neg Hx    Stomach cancer Neg Hx     Social History   Tobacco Use   Smoking status: Never   Smokeless tobacco: Never  Vaping Use   Vaping Use: Never used  Substance Use Topics   Alcohol use: Never   Drug use: Never    Current Outpatient Medications  Medication Sig Dispense Refill   aspirin EC 81 MG tablet Take 1 tablet (81 mg total) by mouth daily. Swallow whole. 90 tablet 3   ezetimibe (ZETIA) 10 MG tablet Take 10 mg by mouth as needed.     lisinopril-hydrochlorothiazide (PRINZIDE,ZESTORETIC) 20-12.5 MG tablet Take 0.5 tablets by mouth daily.     metoprolol succinate (TOPROL-XL) 25 MG 24 hr tablet Take 0.5 tablets by mouth daily.     Multiple Vitamin (MULTIVITAMIN) tablet Take 1 tablet by mouth daily.     Omega 3 1000 MG CAPS Take 1 capsule by mouth daily.     Potassium 99 MG TABS Take 1 tablet by mouth daily.     VITAMIN C-VITAMIN D-ZINC PO Take 1 tablet by mouth daily.     atorvastatin (LIPITOR) 20 MG tablet Take 20 mg by mouth once a week. (Patient not taking: Reported on 09/14/2022)     calcitonin, salmon, (MIACALCIN/FORTICAL) 200 UNIT/ACT nasal spray SMARTSIG:Both Nares (Patient not taking: Reported on 09/14/2022)     Magnesium 400 MG TABS Take 1 tablet by mouth daily. (Patient not taking: Reported on 06/02/2021)     Melatonin 3 MG TABS Take 1 tablet by mouth at bedtime as needed (SLEEP).     pregabalin (LYRICA) 50 MG capsule Take by mouth.     pregabalin (LYRICA) 75 MG capsule Take 75 mg by mouth 3 (three) times daily.     Current Facility-Administered Medications  Medication Dose Route Frequency Provider Last Rate Last Admin   0.9 %  sodium chloride infusion  500 mL Intravenous Continuous Lynann Bologna, MD        No Known Allergies  Review of Systems:  Constitutional: Denies fever, chills, diaphoresis, appetite change and fatigue.  Has OA     Physical Exam:    BP (!) 148/77   Pulse 95   Temp (!) 97.3 F (36.3 C)   Ht  (1.626 m)   Wt 192 lb (87.1 kg)    SpO2 97%   BMI 32.96 kg/m  Filed Weights   09/14/22 1002  Weight: 192 lb (87.1 kg)   Gen: awake, alert, NAD, walking with a walker HEENT: anicteric, no pallor CV: RRR, no mrg Pulm: CTA b/l Abd: soft, NT/ND, +BS throughout Rectal examination: Performed in presence of Nabina.  No obvious lesions.  No anal stenosis.  Significant stool in the anal vault without obstruction.  Soft stool.  Stool brown heme neg Ext: no c/c/e Neuro: nonfocal   CBC:    Latest Ref Rng & Units 04/07/2020    2:09 PM 12/04/2018   11:11 AM  CBC  WBC 4.0 - 10.5 K/uL 7.2  7.5   Hemoglobin 12.0 - 15.0 g/dL 96.0  45.4   Hematocrit 36.0 - 46.0 % 42.9  42.0   Platelets 150 - 400 K/uL 237  250     CMP:    Latest Ref Rng & Units 04/07/2020    2:09 PM 12/04/2018   11:11 AM 06/04/2018    8:52 AM  CMP  Glucose 70 - 99 mg/dL 098  119  92   BUN 8 - 23 mg/dL Creatinine 0.44 - 1.00 mg/dL 1.47  8.29  5.62   Sodium 135 - 145 mmol/L 135  142  139   Potassium 3.5 - 5.1 mmol/L 3.3  3.9  4.4   Chloride 98 - 111 mmol/L 97  100  98   CO2 22 - 32 mmol/L Calcium 8.9 - 10.3 mg/dL 9.8  9.9  13.0   Total Protein 6.5 - 8.1 g/dL 7.5     Total Bilirubin 0.3 - 1.2 mg/dL 1.0     Alkaline Phos 38 - 126 U/L 87     AST 15 - 41 U/L 21     ALT 0 - 44 U/L 17         Edman Circle, MD 09/14/2022, 10:39 AM  Cc: Alinda Deem, MD

## 2022-09-14 NOTE — Patient Instructions (Signed)
Resume previous diet and continue present medications Repeat flex sigmoidoscopy in 2 years for surveillance, or earlier if needed.   YOU HAD AN ENDOSCOPIC PROCEDURE TODAY AT THE [facility]:   Refer to the procedure report that was given to you for any specific questions about what was found during the examination.  If the procedure report does not answer your questions, please call your gastroenterologist to clarify.  If you requested that your care partner not be given the details of your procedure findings, then the procedure report has been included in a sealed envelope for you to review at your convenience later.  YOU SHOULD EXPECT: Some feelings of bloating in the abdomen. Passage of more gas than usual.  Walking can help get rid of the air that was put into your GI tract during the procedure and reduce the bloating. If you had a lower endoscopy (such as a colonoscopy or flexible sigmoidoscopy) you may notice spotting of blood in your stool or on the toilet paper. If you underwent a bowel prep for your procedure, you may not have a normal bowel movement for a few days.  Please Note:  You might notice some irritation and congestion in your nose or some drainage.  This is from the oxygen used during your procedure.  There is no need for concern and it should clear up in a day or so.  SYMPTOMS TO REPORT IMMEDIATELY:  Following lower endoscopy (colonoscopy or flexible sigmoidoscopy):  Excessive amounts of blood in the stool  Significant tenderness or worsening of abdominal pains  Swelling of the abdomen that is new, acute  Fever of 100F or higher  For urgent or emergent issues, a gastroenterologist can be reached at any hour by calling [phone]. Do not use MyChart messaging for urgent concerns.    DIET:  We do recommend a small meal at first, but then you may proceed to your regular diet.  Drink plenty of fluids but you should avoid alcoholic beverages for 24  hours.  ACTIVITY:  You should plan to take it easy for the rest of today and you should NOT DRIVE or use heavy machinery until tomorrow (because of the sedation medicines used during the test).    FOLLOW UP: Our staff will call the number listed on your records the next business day following your procedure.  We will call around 7:15- 8:00 am to check on you and address any questions or concerns that you may have regarding the information given to you following your procedure. If we do not reach you, we will leave a message.     If any biopsies were taken you will be contacted by phone or by letter within the next 1-3 weeks.  Please call us at [phone] if you have not heard about the biopsies in 3 weeks.    SIGNATURES/CONFIDENTIALITY: You and/or your care partner have signed paperwork which will be entered into your electronic medical record.  These signatures attest to the fact that that the information above on your After Visit Summary has been reviewed and is understood.  Full responsibility of the confidentiality of this discharge information lies with you and/or your care-partner.

## 2022-09-14 NOTE — Progress Notes (Signed)
Report to PACU, RN, vss, BBS= Clear.  

## 2022-09-14 NOTE — Op Note (Signed)
[facility] Patient Name: Yashika Mask Procedure Date: 09/14/2022 10:38 AM MRN: [id number] Endoscopist: Lynann Bologna , MD, [id number] Age: 77 Referring MD:  Date of Birth: February 12, 1946 Gender: Female Account #: [id number] Procedure:                Flexible Sigmoidoscopy Indications:              High risk colon cancer surveillance: Anorectal sq                            cell Ca 12/2018 Stage IIIA (T2N1M0) s/p ChemoXRT                            (mitomycin-C/5FU). Neg CT chest/abdo/pelvis 01/2021                            at [facility]. No recurrence on FS 04/2021 Medicines:                Monitored Anesthesia Care Procedure:                Pre-Anesthesia Assessment:                           - Prior to the procedure, a History and Physical                            was performed, and patient medications and                            allergies were reviewed. The patient's tolerance of                            previous anesthesia was also reviewed. The risks                            and benefits of the procedure and the sedation                            options and risks were discussed with the patient.                            All questions were answered, and informed consent                            was obtained. Prior Anticoagulants: The patient has                            taken no anticoagulant or antiplatelet agents. ASA                            Grade Assessment: II - A patient with mild systemic                            disease. After reviewing the risks and benefits,  the patient was deemed in satisfactory condition to                            undergo the procedure.                           After obtaining informed consent, the scope was                            passed under direct vision. The [id number] Slim SN                            [id number] was introduced through the anus and                            advanced to the  the descending colon. The flexible                            sigmoidoscopy was accomplished without difficulty.                            The patient tolerated the procedure well. The                            quality of the bowel preparation was good. Scope In: 10:43:56 AM Scope Out: 10:48:13 AM Total Procedure Duration: 0 hours 4 minutes 17 seconds  Findings:                 One channel of posterior thrombosed internal                            hemorrhoids were found during retroflexion and                            during perianal exam with surrounding scar tissue.                            No recurrence.                           A few small localized angioectasias without                            bleeding were found in the rectum s/o mild                            radiation proctitis.                           Few diverticula in the sigmoid colon. The exam was                            otherwise without abnormality. Complications:            No immediate complications. Estimated Blood Loss:     Estimated blood loss: none.  Impression:               - Non-bleeding internal hemorrhoids.                           - Mild radiation proctitis.                           - The examination was otherwise normal. No                            recurrence.                           - No specimens collected. Recommendation:           - Discharge patient to home.                           - The findings and recommendations were discussed                            with the patient's family. Repeat FS in 2 years.                            Earlier, if with any problems.                           - FU as needed.                           - D/w daughter. Lynann Bologna, MD 09/14/2022 10:55:16 AM This report has been signed electronically.

## 2022-09-14 NOTE — Progress Notes (Signed)
Patient reports no changes to health or medications since office visit.  

## 2022-09-15 ENCOUNTER — Telehealth: Payer: Self-pay

## 2022-09-15 NOTE — Telephone Encounter (Signed)
  Follow up Call-     09/14/2022   10:09 AM 06/02/2021    9:16 AM 04/26/2020    8:35 AM  Call back number  Post procedure Call Back phone  # [phone]  Permission to leave phone message Yes Yes Yes     Patient questions:  Do you have a fever, pain , or abdominal swelling? No. Pain Score  0 *  Have you tolerated food without any problems? Yes.    Have you been able to return to your normal activities? Yes.    Do you have any questions about your discharge instructions: Diet   No. Medications  No. Follow up visit  No.  Do you have questions or concerns about your Care? No.  Actions: * If pain score is 4 or above: No action needed, pain <4.

## 2022-09-28 DIAGNOSIS — J0100 Acute maxillary sinusitis, unspecified: Secondary | ICD-10-CM | POA: Diagnosis not present

## 2022-10-20 DIAGNOSIS — L0390 Cellulitis, unspecified: Secondary | ICD-10-CM | POA: Diagnosis not present

## 2022-10-20 DIAGNOSIS — L97921 Non-pressure chronic ulcer of unspecified part of left lower leg limited to breakdown of skin: Secondary | ICD-10-CM | POA: Diagnosis not present

## 2022-10-26 DIAGNOSIS — I8310 Varicose veins of unspecified lower extremity with inflammation: Secondary | ICD-10-CM | POA: Diagnosis not present

## 2022-10-26 DIAGNOSIS — C44729 Squamous cell carcinoma of skin of left lower limb, including hip: Secondary | ICD-10-CM | POA: Diagnosis not present

## 2022-10-29 DIAGNOSIS — M79662 Pain in left lower leg: Secondary | ICD-10-CM | POA: Diagnosis not present

## 2022-10-29 DIAGNOSIS — S81802A Unspecified open wound, left lower leg, initial encounter: Secondary | ICD-10-CM | POA: Diagnosis not present

## 2022-11-03 DIAGNOSIS — L97921 Non-pressure chronic ulcer of unspecified part of left lower leg limited to breakdown of skin: Secondary | ICD-10-CM | POA: Diagnosis not present

## 2022-11-13 DIAGNOSIS — L859 Epidermal thickening, unspecified: Secondary | ICD-10-CM | POA: Diagnosis not present

## 2022-11-13 DIAGNOSIS — L97921 Non-pressure chronic ulcer of unspecified part of left lower leg limited to breakdown of skin: Secondary | ICD-10-CM | POA: Diagnosis not present

## 2022-11-20 DIAGNOSIS — M7989 Other specified soft tissue disorders: Secondary | ICD-10-CM | POA: Diagnosis not present

## 2022-11-29 DIAGNOSIS — R2242 Localized swelling, mass and lump, left lower limb: Secondary | ICD-10-CM | POA: Diagnosis not present

## 2022-11-29 DIAGNOSIS — L97929 Non-pressure chronic ulcer of unspecified part of left lower leg with unspecified severity: Secondary | ICD-10-CM | POA: Diagnosis not present

## 2022-11-29 DIAGNOSIS — L97829 Non-pressure chronic ulcer of other part of left lower leg with unspecified severity: Secondary | ICD-10-CM | POA: Diagnosis not present

## 2022-12-04 DIAGNOSIS — M7989 Other specified soft tissue disorders: Secondary | ICD-10-CM | POA: Diagnosis not present

## 2022-12-07 DIAGNOSIS — R7301 Impaired fasting glucose: Secondary | ICD-10-CM | POA: Diagnosis not present

## 2022-12-07 DIAGNOSIS — G729 Myopathy, unspecified: Secondary | ICD-10-CM | POA: Diagnosis not present

## 2022-12-07 DIAGNOSIS — M545 Low back pain, unspecified: Secondary | ICD-10-CM | POA: Diagnosis not present

## 2022-12-07 DIAGNOSIS — Z85038 Personal history of other malignant neoplasm of large intestine: Secondary | ICD-10-CM | POA: Diagnosis not present

## 2022-12-07 DIAGNOSIS — I10 Essential (primary) hypertension: Secondary | ICD-10-CM | POA: Diagnosis not present

## 2022-12-07 DIAGNOSIS — E669 Obesity, unspecified: Secondary | ICD-10-CM | POA: Diagnosis not present

## 2022-12-07 DIAGNOSIS — E7800 Pure hypercholesterolemia, unspecified: Secondary | ICD-10-CM | POA: Diagnosis not present

## 2022-12-07 DIAGNOSIS — I700 Atherosclerosis of aorta: Secondary | ICD-10-CM | POA: Diagnosis not present

## 2022-12-07 DIAGNOSIS — Z6832 Body mass index (BMI) 32.0-32.9, adult: Secondary | ICD-10-CM | POA: Diagnosis not present

## 2022-12-14 DIAGNOSIS — Z923 Personal history of irradiation: Secondary | ICD-10-CM | POA: Diagnosis not present

## 2022-12-14 DIAGNOSIS — M7989 Other specified soft tissue disorders: Secondary | ICD-10-CM | POA: Diagnosis not present

## 2022-12-14 DIAGNOSIS — L905 Scar conditions and fibrosis of skin: Secondary | ICD-10-CM | POA: Diagnosis not present

## 2022-12-14 DIAGNOSIS — L942 Calcinosis cutis: Secondary | ICD-10-CM | POA: Diagnosis not present

## 2022-12-14 DIAGNOSIS — D2122 Benign neoplasm of connective and other soft tissue of left lower limb, including hip: Secondary | ICD-10-CM | POA: Diagnosis not present

## 2022-12-18 DIAGNOSIS — I10 Essential (primary) hypertension: Secondary | ICD-10-CM | POA: Diagnosis not present

## 2022-12-18 DIAGNOSIS — Z4801 Encounter for change or removal of surgical wound dressing: Secondary | ICD-10-CM | POA: Diagnosis not present

## 2022-12-18 DIAGNOSIS — E785 Hyperlipidemia, unspecified: Secondary | ICD-10-CM | POA: Diagnosis not present

## 2022-12-18 DIAGNOSIS — M7989 Other specified soft tissue disorders: Secondary | ICD-10-CM | POA: Diagnosis not present

## 2022-12-18 DIAGNOSIS — I480 Paroxysmal atrial fibrillation: Secondary | ICD-10-CM | POA: Diagnosis not present

## 2022-12-18 DIAGNOSIS — Z7982 Long term (current) use of aspirin: Secondary | ICD-10-CM | POA: Diagnosis not present

## 2022-12-18 DIAGNOSIS — Z85048 Personal history of other malignant neoplasm of rectum, rectosigmoid junction, and anus: Secondary | ICD-10-CM | POA: Diagnosis not present

## 2022-12-18 DIAGNOSIS — Z96652 Presence of left artificial knee joint: Secondary | ICD-10-CM | POA: Diagnosis not present

## 2022-12-18 DIAGNOSIS — Z48817 Encounter for surgical aftercare following surgery on the skin and subcutaneous tissue: Secondary | ICD-10-CM | POA: Diagnosis not present

## 2022-12-21 DIAGNOSIS — Z7982 Long term (current) use of aspirin: Secondary | ICD-10-CM | POA: Diagnosis not present

## 2022-12-21 DIAGNOSIS — I10 Essential (primary) hypertension: Secondary | ICD-10-CM | POA: Diagnosis not present

## 2022-12-21 DIAGNOSIS — E785 Hyperlipidemia, unspecified: Secondary | ICD-10-CM | POA: Diagnosis not present

## 2022-12-21 DIAGNOSIS — M7989 Other specified soft tissue disorders: Secondary | ICD-10-CM | POA: Diagnosis not present

## 2022-12-21 DIAGNOSIS — Z85048 Personal history of other malignant neoplasm of rectum, rectosigmoid junction, and anus: Secondary | ICD-10-CM | POA: Diagnosis not present

## 2022-12-21 DIAGNOSIS — I480 Paroxysmal atrial fibrillation: Secondary | ICD-10-CM | POA: Diagnosis not present

## 2022-12-21 DIAGNOSIS — Z96652 Presence of left artificial knee joint: Secondary | ICD-10-CM | POA: Diagnosis not present

## 2022-12-21 DIAGNOSIS — Z48817 Encounter for surgical aftercare following surgery on the skin and subcutaneous tissue: Secondary | ICD-10-CM | POA: Diagnosis not present

## 2022-12-21 DIAGNOSIS — Z4801 Encounter for change or removal of surgical wound dressing: Secondary | ICD-10-CM | POA: Diagnosis not present

## 2022-12-25 DIAGNOSIS — Z96652 Presence of left artificial knee joint: Secondary | ICD-10-CM | POA: Diagnosis not present

## 2022-12-25 DIAGNOSIS — I480 Paroxysmal atrial fibrillation: Secondary | ICD-10-CM | POA: Diagnosis not present

## 2022-12-25 DIAGNOSIS — I10 Essential (primary) hypertension: Secondary | ICD-10-CM | POA: Diagnosis not present

## 2022-12-25 DIAGNOSIS — M7989 Other specified soft tissue disorders: Secondary | ICD-10-CM | POA: Diagnosis not present

## 2022-12-25 DIAGNOSIS — Z7982 Long term (current) use of aspirin: Secondary | ICD-10-CM | POA: Diagnosis not present

## 2022-12-25 DIAGNOSIS — Z85048 Personal history of other malignant neoplasm of rectum, rectosigmoid junction, and anus: Secondary | ICD-10-CM | POA: Diagnosis not present

## 2022-12-25 DIAGNOSIS — Z48817 Encounter for surgical aftercare following surgery on the skin and subcutaneous tissue: Secondary | ICD-10-CM | POA: Diagnosis not present

## 2022-12-25 DIAGNOSIS — E785 Hyperlipidemia, unspecified: Secondary | ICD-10-CM | POA: Diagnosis not present

## 2022-12-25 DIAGNOSIS — Z4801 Encounter for change or removal of surgical wound dressing: Secondary | ICD-10-CM | POA: Diagnosis not present

## 2022-12-29 DIAGNOSIS — Z4801 Encounter for change or removal of surgical wound dressing: Secondary | ICD-10-CM | POA: Diagnosis not present

## 2022-12-29 DIAGNOSIS — I10 Essential (primary) hypertension: Secondary | ICD-10-CM | POA: Diagnosis not present

## 2022-12-29 DIAGNOSIS — E785 Hyperlipidemia, unspecified: Secondary | ICD-10-CM | POA: Diagnosis not present

## 2022-12-29 DIAGNOSIS — Z96652 Presence of left artificial knee joint: Secondary | ICD-10-CM | POA: Diagnosis not present

## 2022-12-29 DIAGNOSIS — M7989 Other specified soft tissue disorders: Secondary | ICD-10-CM | POA: Diagnosis not present

## 2022-12-29 DIAGNOSIS — Z85048 Personal history of other malignant neoplasm of rectum, rectosigmoid junction, and anus: Secondary | ICD-10-CM | POA: Diagnosis not present

## 2022-12-29 DIAGNOSIS — I480 Paroxysmal atrial fibrillation: Secondary | ICD-10-CM | POA: Diagnosis not present

## 2022-12-29 DIAGNOSIS — Z48817 Encounter for surgical aftercare following surgery on the skin and subcutaneous tissue: Secondary | ICD-10-CM | POA: Diagnosis not present

## 2022-12-29 DIAGNOSIS — Z7982 Long term (current) use of aspirin: Secondary | ICD-10-CM | POA: Diagnosis not present

## 2023-01-01 DIAGNOSIS — M7989 Other specified soft tissue disorders: Secondary | ICD-10-CM | POA: Diagnosis not present

## 2023-01-04 DIAGNOSIS — Z96652 Presence of left artificial knee joint: Secondary | ICD-10-CM | POA: Diagnosis not present

## 2023-01-04 DIAGNOSIS — E785 Hyperlipidemia, unspecified: Secondary | ICD-10-CM | POA: Diagnosis not present

## 2023-01-04 DIAGNOSIS — Z7982 Long term (current) use of aspirin: Secondary | ICD-10-CM | POA: Diagnosis not present

## 2023-01-04 DIAGNOSIS — I480 Paroxysmal atrial fibrillation: Secondary | ICD-10-CM | POA: Diagnosis not present

## 2023-01-04 DIAGNOSIS — Z85048 Personal history of other malignant neoplasm of rectum, rectosigmoid junction, and anus: Secondary | ICD-10-CM | POA: Diagnosis not present

## 2023-01-04 DIAGNOSIS — I10 Essential (primary) hypertension: Secondary | ICD-10-CM | POA: Diagnosis not present

## 2023-01-04 DIAGNOSIS — M7989 Other specified soft tissue disorders: Secondary | ICD-10-CM | POA: Diagnosis not present

## 2023-01-04 DIAGNOSIS — Z48817 Encounter for surgical aftercare following surgery on the skin and subcutaneous tissue: Secondary | ICD-10-CM | POA: Diagnosis not present

## 2023-01-04 DIAGNOSIS — Z4801 Encounter for change or removal of surgical wound dressing: Secondary | ICD-10-CM | POA: Diagnosis not present

## 2023-01-08 DIAGNOSIS — Z4801 Encounter for change or removal of surgical wound dressing: Secondary | ICD-10-CM | POA: Diagnosis not present

## 2023-01-08 DIAGNOSIS — Z7982 Long term (current) use of aspirin: Secondary | ICD-10-CM | POA: Diagnosis not present

## 2023-01-08 DIAGNOSIS — I480 Paroxysmal atrial fibrillation: Secondary | ICD-10-CM | POA: Diagnosis not present

## 2023-01-08 DIAGNOSIS — Z48817 Encounter for surgical aftercare following surgery on the skin and subcutaneous tissue: Secondary | ICD-10-CM | POA: Diagnosis not present

## 2023-01-08 DIAGNOSIS — Z96652 Presence of left artificial knee joint: Secondary | ICD-10-CM | POA: Diagnosis not present

## 2023-01-08 DIAGNOSIS — Z85048 Personal history of other malignant neoplasm of rectum, rectosigmoid junction, and anus: Secondary | ICD-10-CM | POA: Diagnosis not present

## 2023-01-08 DIAGNOSIS — I10 Essential (primary) hypertension: Secondary | ICD-10-CM | POA: Diagnosis not present

## 2023-01-08 DIAGNOSIS — M7989 Other specified soft tissue disorders: Secondary | ICD-10-CM | POA: Diagnosis not present

## 2023-01-08 DIAGNOSIS — E785 Hyperlipidemia, unspecified: Secondary | ICD-10-CM | POA: Diagnosis not present

## 2023-01-09 DIAGNOSIS — M7989 Other specified soft tissue disorders: Secondary | ICD-10-CM | POA: Diagnosis not present

## 2023-01-09 DIAGNOSIS — L089 Local infection of the skin and subcutaneous tissue, unspecified: Secondary | ICD-10-CM | POA: Diagnosis not present

## 2023-01-11 DIAGNOSIS — E785 Hyperlipidemia, unspecified: Secondary | ICD-10-CM | POA: Diagnosis not present

## 2023-01-11 DIAGNOSIS — Z7982 Long term (current) use of aspirin: Secondary | ICD-10-CM | POA: Diagnosis not present

## 2023-01-11 DIAGNOSIS — I480 Paroxysmal atrial fibrillation: Secondary | ICD-10-CM | POA: Diagnosis not present

## 2023-01-11 DIAGNOSIS — Z85048 Personal history of other malignant neoplasm of rectum, rectosigmoid junction, and anus: Secondary | ICD-10-CM | POA: Diagnosis not present

## 2023-01-11 DIAGNOSIS — I10 Essential (primary) hypertension: Secondary | ICD-10-CM | POA: Diagnosis not present

## 2023-01-11 DIAGNOSIS — Z48817 Encounter for surgical aftercare following surgery on the skin and subcutaneous tissue: Secondary | ICD-10-CM | POA: Diagnosis not present

## 2023-01-11 DIAGNOSIS — Z4801 Encounter for change or removal of surgical wound dressing: Secondary | ICD-10-CM | POA: Diagnosis not present

## 2023-01-11 DIAGNOSIS — Z96652 Presence of left artificial knee joint: Secondary | ICD-10-CM | POA: Diagnosis not present

## 2023-01-11 DIAGNOSIS — M7989 Other specified soft tissue disorders: Secondary | ICD-10-CM | POA: Diagnosis not present

## 2023-01-12 DIAGNOSIS — L089 Local infection of the skin and subcutaneous tissue, unspecified: Secondary | ICD-10-CM | POA: Diagnosis not present

## 2023-01-15 DIAGNOSIS — Z96652 Presence of left artificial knee joint: Secondary | ICD-10-CM | POA: Diagnosis not present

## 2023-01-15 DIAGNOSIS — Z7982 Long term (current) use of aspirin: Secondary | ICD-10-CM | POA: Diagnosis not present

## 2023-01-15 DIAGNOSIS — Z4801 Encounter for change or removal of surgical wound dressing: Secondary | ICD-10-CM | POA: Diagnosis not present

## 2023-01-15 DIAGNOSIS — M7989 Other specified soft tissue disorders: Secondary | ICD-10-CM | POA: Diagnosis not present

## 2023-01-15 DIAGNOSIS — Z48817 Encounter for surgical aftercare following surgery on the skin and subcutaneous tissue: Secondary | ICD-10-CM | POA: Diagnosis not present

## 2023-01-15 DIAGNOSIS — I480 Paroxysmal atrial fibrillation: Secondary | ICD-10-CM | POA: Diagnosis not present

## 2023-01-15 DIAGNOSIS — I10 Essential (primary) hypertension: Secondary | ICD-10-CM | POA: Diagnosis not present

## 2023-01-15 DIAGNOSIS — E785 Hyperlipidemia, unspecified: Secondary | ICD-10-CM | POA: Diagnosis not present

## 2023-01-15 DIAGNOSIS — Z85048 Personal history of other malignant neoplasm of rectum, rectosigmoid junction, and anus: Secondary | ICD-10-CM | POA: Diagnosis not present

## 2023-01-17 DIAGNOSIS — E785 Hyperlipidemia, unspecified: Secondary | ICD-10-CM | POA: Diagnosis not present

## 2023-01-17 DIAGNOSIS — I10 Essential (primary) hypertension: Secondary | ICD-10-CM | POA: Diagnosis not present

## 2023-01-17 DIAGNOSIS — M7989 Other specified soft tissue disorders: Secondary | ICD-10-CM | POA: Diagnosis not present

## 2023-01-17 DIAGNOSIS — Z4801 Encounter for change or removal of surgical wound dressing: Secondary | ICD-10-CM | POA: Diagnosis not present

## 2023-01-17 DIAGNOSIS — Z85048 Personal history of other malignant neoplasm of rectum, rectosigmoid junction, and anus: Secondary | ICD-10-CM | POA: Diagnosis not present

## 2023-01-17 DIAGNOSIS — Z7982 Long term (current) use of aspirin: Secondary | ICD-10-CM | POA: Diagnosis not present

## 2023-01-17 DIAGNOSIS — Z48817 Encounter for surgical aftercare following surgery on the skin and subcutaneous tissue: Secondary | ICD-10-CM | POA: Diagnosis not present

## 2023-01-17 DIAGNOSIS — I480 Paroxysmal atrial fibrillation: Secondary | ICD-10-CM | POA: Diagnosis not present

## 2023-01-17 DIAGNOSIS — Z96652 Presence of left artificial knee joint: Secondary | ICD-10-CM | POA: Diagnosis not present

## 2023-01-18 DIAGNOSIS — Z96652 Presence of left artificial knee joint: Secondary | ICD-10-CM | POA: Diagnosis not present

## 2023-01-18 DIAGNOSIS — M7989 Other specified soft tissue disorders: Secondary | ICD-10-CM | POA: Diagnosis not present

## 2023-01-18 DIAGNOSIS — I10 Essential (primary) hypertension: Secondary | ICD-10-CM | POA: Diagnosis not present

## 2023-01-18 DIAGNOSIS — Z7982 Long term (current) use of aspirin: Secondary | ICD-10-CM | POA: Diagnosis not present

## 2023-01-18 DIAGNOSIS — I480 Paroxysmal atrial fibrillation: Secondary | ICD-10-CM | POA: Diagnosis not present

## 2023-01-18 DIAGNOSIS — E785 Hyperlipidemia, unspecified: Secondary | ICD-10-CM | POA: Diagnosis not present

## 2023-01-18 DIAGNOSIS — Z48817 Encounter for surgical aftercare following surgery on the skin and subcutaneous tissue: Secondary | ICD-10-CM | POA: Diagnosis not present

## 2023-01-18 DIAGNOSIS — Z85048 Personal history of other malignant neoplasm of rectum, rectosigmoid junction, and anus: Secondary | ICD-10-CM | POA: Diagnosis not present

## 2023-01-18 DIAGNOSIS — Z4801 Encounter for change or removal of surgical wound dressing: Secondary | ICD-10-CM | POA: Diagnosis not present

## 2023-01-19 DIAGNOSIS — S81802A Unspecified open wound, left lower leg, initial encounter: Secondary | ICD-10-CM | POA: Diagnosis not present

## 2023-01-19 DIAGNOSIS — S81802D Unspecified open wound, left lower leg, subsequent encounter: Secondary | ICD-10-CM | POA: Diagnosis not present

## 2023-01-19 DIAGNOSIS — M7989 Other specified soft tissue disorders: Secondary | ICD-10-CM | POA: Diagnosis not present

## 2023-01-24 DIAGNOSIS — Z09 Encounter for follow-up examination after completed treatment for conditions other than malignant neoplasm: Secondary | ICD-10-CM | POA: Diagnosis not present

## 2023-01-25 DIAGNOSIS — Z4801 Encounter for change or removal of surgical wound dressing: Secondary | ICD-10-CM | POA: Diagnosis not present

## 2023-01-25 DIAGNOSIS — Z48817 Encounter for surgical aftercare following surgery on the skin and subcutaneous tissue: Secondary | ICD-10-CM | POA: Diagnosis not present

## 2023-01-25 DIAGNOSIS — M7989 Other specified soft tissue disorders: Secondary | ICD-10-CM | POA: Diagnosis not present

## 2023-01-25 DIAGNOSIS — Z7982 Long term (current) use of aspirin: Secondary | ICD-10-CM | POA: Diagnosis not present

## 2023-01-25 DIAGNOSIS — I10 Essential (primary) hypertension: Secondary | ICD-10-CM | POA: Diagnosis not present

## 2023-01-25 DIAGNOSIS — Z96652 Presence of left artificial knee joint: Secondary | ICD-10-CM | POA: Diagnosis not present

## 2023-01-25 DIAGNOSIS — E785 Hyperlipidemia, unspecified: Secondary | ICD-10-CM | POA: Diagnosis not present

## 2023-01-25 DIAGNOSIS — Z85048 Personal history of other malignant neoplasm of rectum, rectosigmoid junction, and anus: Secondary | ICD-10-CM | POA: Diagnosis not present

## 2023-01-25 DIAGNOSIS — I480 Paroxysmal atrial fibrillation: Secondary | ICD-10-CM | POA: Diagnosis not present

## 2023-01-29 DIAGNOSIS — Z48817 Encounter for surgical aftercare following surgery on the skin and subcutaneous tissue: Secondary | ICD-10-CM | POA: Diagnosis not present

## 2023-01-29 DIAGNOSIS — Z96652 Presence of left artificial knee joint: Secondary | ICD-10-CM | POA: Diagnosis not present

## 2023-01-29 DIAGNOSIS — E785 Hyperlipidemia, unspecified: Secondary | ICD-10-CM | POA: Diagnosis not present

## 2023-01-29 DIAGNOSIS — I480 Paroxysmal atrial fibrillation: Secondary | ICD-10-CM | POA: Diagnosis not present

## 2023-01-29 DIAGNOSIS — I10 Essential (primary) hypertension: Secondary | ICD-10-CM | POA: Diagnosis not present

## 2023-01-29 DIAGNOSIS — Z7982 Long term (current) use of aspirin: Secondary | ICD-10-CM | POA: Diagnosis not present

## 2023-01-29 DIAGNOSIS — Z4801 Encounter for change or removal of surgical wound dressing: Secondary | ICD-10-CM | POA: Diagnosis not present

## 2023-01-29 DIAGNOSIS — Z85048 Personal history of other malignant neoplasm of rectum, rectosigmoid junction, and anus: Secondary | ICD-10-CM | POA: Diagnosis not present

## 2023-01-29 DIAGNOSIS — M7989 Other specified soft tissue disorders: Secondary | ICD-10-CM | POA: Diagnosis not present

## 2023-02-01 DIAGNOSIS — I10 Essential (primary) hypertension: Secondary | ICD-10-CM | POA: Diagnosis not present

## 2023-02-01 DIAGNOSIS — E785 Hyperlipidemia, unspecified: Secondary | ICD-10-CM | POA: Diagnosis not present

## 2023-02-01 DIAGNOSIS — Z7982 Long term (current) use of aspirin: Secondary | ICD-10-CM | POA: Diagnosis not present

## 2023-02-01 DIAGNOSIS — M7989 Other specified soft tissue disorders: Secondary | ICD-10-CM | POA: Diagnosis not present

## 2023-02-01 DIAGNOSIS — I480 Paroxysmal atrial fibrillation: Secondary | ICD-10-CM | POA: Diagnosis not present

## 2023-02-01 DIAGNOSIS — Z96652 Presence of left artificial knee joint: Secondary | ICD-10-CM | POA: Diagnosis not present

## 2023-02-01 DIAGNOSIS — Z85048 Personal history of other malignant neoplasm of rectum, rectosigmoid junction, and anus: Secondary | ICD-10-CM | POA: Diagnosis not present

## 2023-02-01 DIAGNOSIS — Z48817 Encounter for surgical aftercare following surgery on the skin and subcutaneous tissue: Secondary | ICD-10-CM | POA: Diagnosis not present

## 2023-02-01 DIAGNOSIS — Z4801 Encounter for change or removal of surgical wound dressing: Secondary | ICD-10-CM | POA: Diagnosis not present

## 2023-02-05 DIAGNOSIS — Z09 Encounter for follow-up examination after completed treatment for conditions other than malignant neoplasm: Secondary | ICD-10-CM | POA: Diagnosis not present

## 2023-02-08 DIAGNOSIS — Z48817 Encounter for surgical aftercare following surgery on the skin and subcutaneous tissue: Secondary | ICD-10-CM | POA: Diagnosis not present

## 2023-02-08 DIAGNOSIS — Z85048 Personal history of other malignant neoplasm of rectum, rectosigmoid junction, and anus: Secondary | ICD-10-CM | POA: Diagnosis not present

## 2023-02-08 DIAGNOSIS — Z96652 Presence of left artificial knee joint: Secondary | ICD-10-CM | POA: Diagnosis not present

## 2023-02-08 DIAGNOSIS — E785 Hyperlipidemia, unspecified: Secondary | ICD-10-CM | POA: Diagnosis not present

## 2023-02-08 DIAGNOSIS — I10 Essential (primary) hypertension: Secondary | ICD-10-CM | POA: Diagnosis not present

## 2023-02-08 DIAGNOSIS — Z7982 Long term (current) use of aspirin: Secondary | ICD-10-CM | POA: Diagnosis not present

## 2023-02-08 DIAGNOSIS — I480 Paroxysmal atrial fibrillation: Secondary | ICD-10-CM | POA: Diagnosis not present

## 2023-02-08 DIAGNOSIS — M7989 Other specified soft tissue disorders: Secondary | ICD-10-CM | POA: Diagnosis not present

## 2023-02-08 DIAGNOSIS — Z4801 Encounter for change or removal of surgical wound dressing: Secondary | ICD-10-CM | POA: Diagnosis not present

## 2023-02-12 DIAGNOSIS — I10 Essential (primary) hypertension: Secondary | ICD-10-CM | POA: Diagnosis not present

## 2023-02-12 DIAGNOSIS — Z23 Encounter for immunization: Secondary | ICD-10-CM | POA: Diagnosis not present

## 2023-02-12 DIAGNOSIS — Z1389 Encounter for screening for other disorder: Secondary | ICD-10-CM | POA: Diagnosis not present

## 2023-02-12 DIAGNOSIS — I700 Atherosclerosis of aorta: Secondary | ICD-10-CM | POA: Diagnosis not present

## 2023-02-12 DIAGNOSIS — I4891 Unspecified atrial fibrillation: Secondary | ICD-10-CM | POA: Diagnosis not present

## 2023-02-12 DIAGNOSIS — E7800 Pure hypercholesterolemia, unspecified: Secondary | ICD-10-CM | POA: Diagnosis not present

## 2023-02-12 DIAGNOSIS — R5383 Other fatigue: Secondary | ICD-10-CM | POA: Diagnosis not present

## 2023-02-12 DIAGNOSIS — Z0000 Encounter for general adult medical examination without abnormal findings: Secondary | ICD-10-CM | POA: Diagnosis not present

## 2023-02-12 DIAGNOSIS — E559 Vitamin D deficiency, unspecified: Secondary | ICD-10-CM | POA: Diagnosis not present

## 2023-02-12 DIAGNOSIS — G729 Myopathy, unspecified: Secondary | ICD-10-CM | POA: Diagnosis not present

## 2023-02-12 DIAGNOSIS — Z85048 Personal history of other malignant neoplasm of rectum, rectosigmoid junction, and anus: Secondary | ICD-10-CM | POA: Diagnosis not present

## 2023-02-13 DIAGNOSIS — Z96652 Presence of left artificial knee joint: Secondary | ICD-10-CM | POA: Diagnosis not present

## 2023-02-13 DIAGNOSIS — M7989 Other specified soft tissue disorders: Secondary | ICD-10-CM | POA: Diagnosis not present

## 2023-02-13 DIAGNOSIS — Z7982 Long term (current) use of aspirin: Secondary | ICD-10-CM | POA: Diagnosis not present

## 2023-02-13 DIAGNOSIS — Z85048 Personal history of other malignant neoplasm of rectum, rectosigmoid junction, and anus: Secondary | ICD-10-CM | POA: Diagnosis not present

## 2023-02-13 DIAGNOSIS — E785 Hyperlipidemia, unspecified: Secondary | ICD-10-CM | POA: Diagnosis not present

## 2023-02-13 DIAGNOSIS — Z4801 Encounter for change or removal of surgical wound dressing: Secondary | ICD-10-CM | POA: Diagnosis not present

## 2023-02-13 DIAGNOSIS — Z48817 Encounter for surgical aftercare following surgery on the skin and subcutaneous tissue: Secondary | ICD-10-CM | POA: Diagnosis not present

## 2023-02-13 DIAGNOSIS — I480 Paroxysmal atrial fibrillation: Secondary | ICD-10-CM | POA: Diagnosis not present

## 2023-02-13 DIAGNOSIS — I10 Essential (primary) hypertension: Secondary | ICD-10-CM | POA: Diagnosis not present

## 2023-02-15 DIAGNOSIS — S81802D Unspecified open wound, left lower leg, subsequent encounter: Secondary | ICD-10-CM | POA: Diagnosis not present

## 2023-02-15 DIAGNOSIS — I10 Essential (primary) hypertension: Secondary | ICD-10-CM | POA: Diagnosis not present

## 2023-02-15 DIAGNOSIS — M7989 Other specified soft tissue disorders: Secondary | ICD-10-CM | POA: Diagnosis not present

## 2023-02-15 DIAGNOSIS — Z48817 Encounter for surgical aftercare following surgery on the skin and subcutaneous tissue: Secondary | ICD-10-CM | POA: Diagnosis not present

## 2023-02-15 DIAGNOSIS — Z7982 Long term (current) use of aspirin: Secondary | ICD-10-CM | POA: Diagnosis not present

## 2023-02-15 DIAGNOSIS — Z4801 Encounter for change or removal of surgical wound dressing: Secondary | ICD-10-CM | POA: Diagnosis not present

## 2023-02-15 DIAGNOSIS — I480 Paroxysmal atrial fibrillation: Secondary | ICD-10-CM | POA: Diagnosis not present

## 2023-02-15 DIAGNOSIS — E785 Hyperlipidemia, unspecified: Secondary | ICD-10-CM | POA: Diagnosis not present

## 2023-02-15 DIAGNOSIS — Z85048 Personal history of other malignant neoplasm of rectum, rectosigmoid junction, and anus: Secondary | ICD-10-CM | POA: Diagnosis not present

## 2023-02-15 DIAGNOSIS — Z96652 Presence of left artificial knee joint: Secondary | ICD-10-CM | POA: Diagnosis not present

## 2023-02-19 DIAGNOSIS — I872 Venous insufficiency (chronic) (peripheral): Secondary | ICD-10-CM | POA: Diagnosis not present

## 2023-02-19 DIAGNOSIS — L97822 Non-pressure chronic ulcer of other part of left lower leg with fat layer exposed: Secondary | ICD-10-CM | POA: Diagnosis not present

## 2023-02-20 DIAGNOSIS — S81802A Unspecified open wound, left lower leg, initial encounter: Secondary | ICD-10-CM | POA: Diagnosis not present

## 2023-02-23 DIAGNOSIS — Z7982 Long term (current) use of aspirin: Secondary | ICD-10-CM | POA: Diagnosis not present

## 2023-02-23 DIAGNOSIS — E785 Hyperlipidemia, unspecified: Secondary | ICD-10-CM | POA: Diagnosis not present

## 2023-02-23 DIAGNOSIS — S81802D Unspecified open wound, left lower leg, subsequent encounter: Secondary | ICD-10-CM | POA: Diagnosis not present

## 2023-02-23 DIAGNOSIS — I10 Essential (primary) hypertension: Secondary | ICD-10-CM | POA: Diagnosis not present

## 2023-02-26 DIAGNOSIS — I872 Venous insufficiency (chronic) (peripheral): Secondary | ICD-10-CM | POA: Diagnosis not present

## 2023-02-26 DIAGNOSIS — L97822 Non-pressure chronic ulcer of other part of left lower leg with fat layer exposed: Secondary | ICD-10-CM | POA: Diagnosis not present

## 2023-03-05 DIAGNOSIS — L97822 Non-pressure chronic ulcer of other part of left lower leg with fat layer exposed: Secondary | ICD-10-CM | POA: Diagnosis not present

## 2023-03-05 DIAGNOSIS — I872 Venous insufficiency (chronic) (peripheral): Secondary | ICD-10-CM | POA: Diagnosis not present

## 2023-03-12 DIAGNOSIS — L97822 Non-pressure chronic ulcer of other part of left lower leg with fat layer exposed: Secondary | ICD-10-CM | POA: Diagnosis not present

## 2023-03-12 DIAGNOSIS — I872 Venous insufficiency (chronic) (peripheral): Secondary | ICD-10-CM | POA: Diagnosis not present

## 2023-03-19 DIAGNOSIS — I872 Venous insufficiency (chronic) (peripheral): Secondary | ICD-10-CM | POA: Diagnosis not present

## 2023-03-19 DIAGNOSIS — L97822 Non-pressure chronic ulcer of other part of left lower leg with fat layer exposed: Secondary | ICD-10-CM | POA: Diagnosis not present

## 2023-03-19 DIAGNOSIS — I878 Other specified disorders of veins: Secondary | ICD-10-CM | POA: Diagnosis not present

## 2023-03-26 DIAGNOSIS — L97822 Non-pressure chronic ulcer of other part of left lower leg with fat layer exposed: Secondary | ICD-10-CM | POA: Diagnosis not present

## 2023-03-26 DIAGNOSIS — Z945 Skin transplant status: Secondary | ICD-10-CM | POA: Diagnosis not present

## 2023-03-26 DIAGNOSIS — I872 Venous insufficiency (chronic) (peripheral): Secondary | ICD-10-CM | POA: Diagnosis not present

## 2023-03-30 DIAGNOSIS — H60391 Other infective otitis externa, right ear: Secondary | ICD-10-CM | POA: Diagnosis not present

## 2023-03-30 DIAGNOSIS — H6011 Cellulitis of right external ear: Secondary | ICD-10-CM | POA: Diagnosis not present

## 2023-04-02 DIAGNOSIS — L97822 Non-pressure chronic ulcer of other part of left lower leg with fat layer exposed: Secondary | ICD-10-CM | POA: Diagnosis not present

## 2023-04-02 DIAGNOSIS — Z945 Skin transplant status: Secondary | ICD-10-CM | POA: Diagnosis not present

## 2023-04-02 DIAGNOSIS — I872 Venous insufficiency (chronic) (peripheral): Secondary | ICD-10-CM | POA: Diagnosis not present

## 2023-04-09 DIAGNOSIS — L97822 Non-pressure chronic ulcer of other part of left lower leg with fat layer exposed: Secondary | ICD-10-CM | POA: Diagnosis not present

## 2023-04-09 DIAGNOSIS — Z945 Skin transplant status: Secondary | ICD-10-CM | POA: Diagnosis not present

## 2023-04-09 DIAGNOSIS — I872 Venous insufficiency (chronic) (peripheral): Secondary | ICD-10-CM | POA: Diagnosis not present

## 2023-04-16 DIAGNOSIS — I872 Venous insufficiency (chronic) (peripheral): Secondary | ICD-10-CM | POA: Diagnosis not present

## 2023-04-16 DIAGNOSIS — L97822 Non-pressure chronic ulcer of other part of left lower leg with fat layer exposed: Secondary | ICD-10-CM | POA: Diagnosis not present

## 2023-04-16 DIAGNOSIS — Z945 Skin transplant status: Secondary | ICD-10-CM | POA: Diagnosis not present

## 2023-04-16 DIAGNOSIS — I878 Other specified disorders of veins: Secondary | ICD-10-CM | POA: Diagnosis not present

## 2023-04-23 DIAGNOSIS — L97822 Non-pressure chronic ulcer of other part of left lower leg with fat layer exposed: Secondary | ICD-10-CM | POA: Diagnosis not present

## 2023-04-23 DIAGNOSIS — I878 Other specified disorders of veins: Secondary | ICD-10-CM | POA: Diagnosis not present

## 2023-04-23 DIAGNOSIS — I872 Venous insufficiency (chronic) (peripheral): Secondary | ICD-10-CM | POA: Diagnosis not present

## 2023-04-30 DIAGNOSIS — L97822 Non-pressure chronic ulcer of other part of left lower leg with fat layer exposed: Secondary | ICD-10-CM | POA: Diagnosis not present

## 2023-04-30 DIAGNOSIS — I872 Venous insufficiency (chronic) (peripheral): Secondary | ICD-10-CM | POA: Diagnosis not present

## 2023-05-07 DIAGNOSIS — L97822 Non-pressure chronic ulcer of other part of left lower leg with fat layer exposed: Secondary | ICD-10-CM | POA: Diagnosis not present

## 2023-05-07 DIAGNOSIS — I872 Venous insufficiency (chronic) (peripheral): Secondary | ICD-10-CM | POA: Diagnosis not present

## 2023-05-14 DIAGNOSIS — I872 Venous insufficiency (chronic) (peripheral): Secondary | ICD-10-CM | POA: Diagnosis not present

## 2023-05-14 DIAGNOSIS — L97822 Non-pressure chronic ulcer of other part of left lower leg with fat layer exposed: Secondary | ICD-10-CM | POA: Diagnosis not present

## 2023-05-18 DIAGNOSIS — Z1231 Encounter for screening mammogram for malignant neoplasm of breast: Secondary | ICD-10-CM | POA: Diagnosis not present

## 2023-05-21 DIAGNOSIS — I872 Venous insufficiency (chronic) (peripheral): Secondary | ICD-10-CM | POA: Diagnosis not present

## 2023-05-21 DIAGNOSIS — L97822 Non-pressure chronic ulcer of other part of left lower leg with fat layer exposed: Secondary | ICD-10-CM | POA: Diagnosis not present

## 2023-06-04 DIAGNOSIS — L97822 Non-pressure chronic ulcer of other part of left lower leg with fat layer exposed: Secondary | ICD-10-CM | POA: Diagnosis not present

## 2023-06-04 DIAGNOSIS — Z9889 Other specified postprocedural states: Secondary | ICD-10-CM | POA: Diagnosis not present

## 2023-06-04 DIAGNOSIS — Z09 Encounter for follow-up examination after completed treatment for conditions other than malignant neoplasm: Secondary | ICD-10-CM | POA: Diagnosis not present

## 2023-06-04 DIAGNOSIS — I872 Venous insufficiency (chronic) (peripheral): Secondary | ICD-10-CM | POA: Diagnosis not present

## 2023-06-08 DIAGNOSIS — E669 Obesity, unspecified: Secondary | ICD-10-CM | POA: Diagnosis not present

## 2023-06-08 DIAGNOSIS — E7800 Pure hypercholesterolemia, unspecified: Secondary | ICD-10-CM | POA: Diagnosis not present

## 2023-06-08 DIAGNOSIS — Z6832 Body mass index (BMI) 32.0-32.9, adult: Secondary | ICD-10-CM | POA: Diagnosis not present

## 2023-06-08 DIAGNOSIS — M545 Low back pain, unspecified: Secondary | ICD-10-CM | POA: Diagnosis not present

## 2023-06-08 DIAGNOSIS — G729 Myopathy, unspecified: Secondary | ICD-10-CM | POA: Diagnosis not present

## 2023-06-08 DIAGNOSIS — I10 Essential (primary) hypertension: Secondary | ICD-10-CM | POA: Diagnosis not present

## 2023-06-08 DIAGNOSIS — Z85038 Personal history of other malignant neoplasm of large intestine: Secondary | ICD-10-CM | POA: Diagnosis not present

## 2023-06-08 DIAGNOSIS — I700 Atherosclerosis of aorta: Secondary | ICD-10-CM | POA: Diagnosis not present

## 2023-06-12 DIAGNOSIS — Z1231 Encounter for screening mammogram for malignant neoplasm of breast: Secondary | ICD-10-CM | POA: Diagnosis not present

## 2023-06-18 DIAGNOSIS — Z9889 Other specified postprocedural states: Secondary | ICD-10-CM | POA: Diagnosis not present

## 2023-06-18 DIAGNOSIS — I872 Venous insufficiency (chronic) (peripheral): Secondary | ICD-10-CM | POA: Diagnosis not present

## 2023-06-18 DIAGNOSIS — Z09 Encounter for follow-up examination after completed treatment for conditions other than malignant neoplasm: Secondary | ICD-10-CM | POA: Diagnosis not present

## 2023-06-18 DIAGNOSIS — L97822 Non-pressure chronic ulcer of other part of left lower leg with fat layer exposed: Secondary | ICD-10-CM | POA: Diagnosis not present

## 2023-08-20 DIAGNOSIS — Z79899 Other long term (current) drug therapy: Secondary | ICD-10-CM | POA: Diagnosis not present

## 2023-08-20 DIAGNOSIS — I10 Essential (primary) hypertension: Secondary | ICD-10-CM | POA: Diagnosis not present

## 2023-08-20 DIAGNOSIS — I4891 Unspecified atrial fibrillation: Secondary | ICD-10-CM | POA: Diagnosis not present

## 2023-08-20 DIAGNOSIS — E7800 Pure hypercholesterolemia, unspecified: Secondary | ICD-10-CM | POA: Diagnosis not present

## 2023-09-19 DIAGNOSIS — Z79899 Other long term (current) drug therapy: Secondary | ICD-10-CM | POA: Diagnosis not present

## 2023-09-19 DIAGNOSIS — E7800 Pure hypercholesterolemia, unspecified: Secondary | ICD-10-CM | POA: Diagnosis not present

## 2023-09-19 DIAGNOSIS — I10 Essential (primary) hypertension: Secondary | ICD-10-CM | POA: Diagnosis not present

## 2023-09-24 ENCOUNTER — Ambulatory Visit: Admitting: Physician Assistant

## 2023-09-24 ENCOUNTER — Encounter: Payer: Self-pay | Admitting: Physician Assistant

## 2023-09-24 ENCOUNTER — Other Ambulatory Visit: Payer: Self-pay

## 2023-09-24 VITALS — BP 120/78 | HR 80 | Ht 63.0 in | Wt 200.0 lb

## 2023-09-24 DIAGNOSIS — Z7982 Long term (current) use of aspirin: Secondary | ICD-10-CM | POA: Diagnosis not present

## 2023-09-24 DIAGNOSIS — Z860101 Personal history of adenomatous and serrated colon polyps: Secondary | ICD-10-CM

## 2023-09-24 DIAGNOSIS — I4891 Unspecified atrial fibrillation: Secondary | ICD-10-CM | POA: Diagnosis not present

## 2023-09-24 DIAGNOSIS — Z85048 Personal history of other malignant neoplasm of rectum, rectosigmoid junction, and anus: Secondary | ICD-10-CM

## 2023-09-24 DIAGNOSIS — I480 Paroxysmal atrial fibrillation: Secondary | ICD-10-CM

## 2023-09-24 DIAGNOSIS — K6289 Other specified diseases of anus and rectum: Secondary | ICD-10-CM

## 2023-09-24 DIAGNOSIS — K5909 Other constipation: Secondary | ICD-10-CM | POA: Diagnosis not present

## 2023-09-24 DIAGNOSIS — K602 Anal fissure, unspecified: Secondary | ICD-10-CM

## 2023-09-24 MED ORDER — AMBULATORY NON FORMULARY MEDICATION: 1 refills | Status: AC

## 2023-09-24 NOTE — Patient Instructions (Addendum)
 You have been scheduled for a flexible sigmoidoscopy. Please follow the written instructions given to you at your visit today.  If you use inhalers (even only as needed), please bring them with you on the day of your procedure.  DO NOT TAKE 7 DAYS PRIOR TO TEST- Trulicity (dulaglutide) Ozempic, Wegovy (semaglutide) Mounjaro (tirzepatide) Bydureon Bcise (exanatide extended release)  DO NOT TAKE 1 DAY PRIOR TO YOUR TEST Rybelsus (semaglutide) Adlyxin (lixisenatide) Victoza (liraglutide) Byetta (exanatide) ___________________________________________________________________________  We have sent a prescription for Diltiazem 2% gel to [facility] for you. Using your index finger, you should apply a small amount of medication inside the rectum up to your first knuckle/joint twice daily x 6 weeks.  [facility]'s information is below: Address: [address] [place], [place] [address]  [phone]  *Please DO NOT go directly from our office to pick up this medication! Give the pharmacy 1 day to process the prescription as this is compounded and takes time to make.  Anal Fissure, Adult  A fissure is a linear defect in the anal mucosa, symptoms include burning, itching, discomfort especially with a bowel movement with associated rectal bleeding.  Risk factors include low fiber diet, chronic constipation and straining. Anal fissures can take a very long time to heal so this will be a 2 to 62-month process.  Treatment for a fissure includes:  -decreasing time in the toilet should not be more than 5 minutes -adding fiber supplement such as Benefiber or Citrucel -increasing water. -I am also going to send in a calcium channel blocker cream to a compound pharmacy, apply twice daily for 12 weeks. If after 3 months this is not helpful then we will we will refer you to general surgery for evaluation, they can do Botox injections under anesthesia or  surgery.  Diltiazem/lidocaine  3 x daily for 2 months sent to compound pharmacy   Sent this medication to a compound pharmacy:  [facility] [address] [place], [place], [place] [address]  [phone]   Please DO NOT go directly from our office to pick up this medication! Give the pharmacy 1 day to process the prescription. Extra time is required for them to compound your medication.  Miralax is an osmotic laxative.  It only brings more water into the stool.  This is safe to take daily.  Can take up to 17 gram of miralax twice a day.  Mix with juice or coffee.  Start 1 capful at night for 3-4 days and reassess your response in 3-4 days.  You can increase and decrease the dose based on your response.  Remember, it can take up to 3-4 days to take effect OR for the effects to wear off.   I often pair this with benefiber in the morning to help assure the stool is not too loose.   Toileting tips to help with your constipation - Drink at least 64-80 ounces of water/liquid per day. - Establish a time to try to move your bowels every day.  For many people, this is after a cup of coffee or after a meal such as breakfast. - Sit all of the way back on the toilet keeping your back fairly straight and while sitting up, try to rest the tops of your forearms on your upper thighs.   - Raising your feet with a step stool/squatty potty can be helpful to improve the angle that allows your stool to pass through the rectum. - Relax the rectum feeling it  bulge toward the toilet water.  If you feel your rectum raising toward your body, you are contracting rather than relaxing. - Breathe in and slowly exhale. "Belly breath" by expanding your belly towards your belly button. Keep belly expanded as you gently direct pressure down and back to the anus.  A low pitched GRRR sound can assist with increasing intra-abdominal pressure.  (Can also trying to blow on a pinwheel and make it move, this helps with the  same belly breathing) - Repeat 3-4 times. If unsuccessful, contract the pelvic floor to restore normal tone and get off the toilet.  Avoid excessive straining. - To reduce excessive wiping by teaching your anus to normally contract, place hands on outer aspect of knees and resist knee movement outward.  Hold 5-10 second then place hands just inside of knees and resist inward movement of knees.  Hold 5 seconds.  Repeat a few times each way.  Go to the ER if unable to pass gas, severe AB pain, unable to hold down food, any shortness of breath of chest pain.  Thank you for entrusting me with your care and choosing [facility].  Santina Cull PA-C

## 2023-09-24 NOTE — Progress Notes (Signed)
 09/24/2023 Caroline Osborne [id number] 12/18/1945  Referring provider: Orin Birk, MD Primary GI doctor: Dr. 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 No hematochezia.  No weight loss Anterior fissure on rectal exam, exam too tender to evaluate with anoscopy Sitz baths, high-fiber diet, add on benefiber.  Long discussion about preventing constipation discussed in detail for prevention.  Diltiazem2%/lidocaine  5% 3 x daily for 2 months on rectum #30 gram 1 refill Set up flexible sigmoidoscopy to assure no recurrence, no proctitis.  Will schedule for flex sigmoidoscopy.  Dulcolax 10 mg tablet night prior, and enema 1-2 hours prior.  We have discussed the risks of bleeding, infection, perforation, medication reactions, and remote risk of death associated with Flex sigmoidoscopy. All questions were answered and the patient acknowledges these risk and wishes to proceed.  Chronic constipation 08/29/22 mild fecal loading in the colon Has BM daily, feels incomplete BM and has to strain, unknown if hard/soft, has increased protein with left leg wound that has healed She is on miralax AS needed Has had some fecal incontinence in the past, urinary incontinence - Increase fiber/ water intake, decrease caffeine, increase activity level. -Will add on Miralax daily -possible component of pelvis floor dysfunction with history and symptoms, consider pelvic floor PT  Personal history of tubular adenomatous polyps 12/2018 colonoscopy without polyps, did  have small TA polyps 2018  Atrial fibrillation On baby aspirin   Patient Care Team: Orin Birk, MD as PCP - General (Family Medicine) Hildred Lowenstein, DO as Consulting Physician (Orthopedic Surgery)  HISTORY OF PRESENT ILLNESS: 78 y.o. female with a past medical history listed below presents for evaluation of rectal pain.  Last seen in the office 08/29/2022 with history of constipation, adenomatous polyps, anorectal squamous cell cancer 2020 status post chemotherapy radiation no recurrence FS1/2023  Discussed the use of AI scribe software for clinical note transcription with the patient, who gave verbal consent to proceed.  History of Present Illness   Caroline Osborne is a 78 year old female with a history of trans anal excision and chemotherapy for squamous cell cancer who presents with rectal pain and constipation.  She has a history of trans anal excision in 2012 and chemotherapy with radiation in 2020 for squamous cell cancer. Her last flexible sigmoidoscopy in April 2024 showed internal hemorrhoids and radiation proctitis. She has been experiencing rectal pain for about a year, which has gradually worsened and is primarily exacerbated by sitting and bending over. The pain is described as uncomfortable with some itching, but there is no burning or sharp pain. The pain is not significantly worsened by bowel movements.  She experiences constipation, having bowel movements almost daily but often feels the need to strain and reports incomplete evacuation. The stool is often in small pieces, and she occasionally experiences fecal incontinence, though not recently. She uses Miralax as needed for constipation. Her diet is high in protein, aiming for 90 grams daily, and includes protein shakes and yogurt. She believes sitting with her foot up has contributed to her constipation.  No blood in the stool, weight loss, shortness of breath, or chest pain.  She takes a baby aspirin  daily and has  been told she has atrial fibrillation, but is not on a blood thinner. She occasionally experiences urinary incontinence when sneezing.      She  reports that she has never smoked. She has never used smokeless tobacco. She reports that she does not drink alcohol  and does not use drugs.  RELEVANT GI HISTORY, IMAGING AND LABS: Results   DIAGNOSTIC Flexible sigmoidoscopy: Internal hemorrhoids, radiation proctitis (08/2022)     Colonoscopy 01/02/2019 - Polypoid ulcerated lesion in the distal rectum involving the dentate line. Bx-squamous cell carcinoma.. - Mild sigmoid diverticulosis.   FS 04/2020 -One 4 mm polyp in the distal rectum, removed with a hot snare. Resected and retrieved. -Mild radiation proctitis. -Mild sigmoid diverticulosis. -Scarring was noted in the anal canal and proximal rectum without any obvious masses.   FS 06/02/2021: neg   MRI pelvis 12/2018: 3.5 cm with extension through the muscularis propria.  Mesorectal lymph node measuring 5 mm.   CT chest Abdo/pelvis 12/2019 at [facility]: neg   CT AP Sept 2, 2022 at [facility] -No evidence of recurrent or metastatic carcinoma within the abdomen or pelvis. -Small hiatal hernia. -Small uterine fibroid. -Aortic Atherosclerosis (ICD10-I70.0).  CBC    Component Value Date/Time   WBC 7.2 04/07/2020 1409   RBC 4.57 04/07/2020 1409   HGB 13.7 04/07/2020 1409   HGB 14.0 12/04/2018 1111   HCT 42.9 04/07/2020 1409   HCT 42.0 12/04/2018 1111   PLT 237 04/07/2020 1409   PLT 250 12/04/2018 1111   MCV 93.9 04/07/2020 1409   MCV 87 12/04/2018 1111   MCH 30.0 04/07/2020 1409   MCHC 31.9 04/07/2020 1409   RDW 12.4 04/07/2020 1409   RDW 13.7 12/04/2018 1111   LYMPHSABS 2.6 04/07/2020 1409   LYMPHSABS 2.3 12/04/2018 1111   MONOABS 0.5 04/07/2020 1409   EOSABS 0.3 04/07/2020 1409   EOSABS 0.1 12/04/2018 1111   BASOSABS 0.0 04/07/2020 1409   BASOSABS 0.0 12/04/2018 1111   No results for input(s): "HGB" in the last 8760  hours.  CMP     Component Value Date/Time   NA 135 04/07/2020 1409   NA 142 12/04/2018 1111   K 3.3 (L) 04/07/2020 1409   CL 97 (L) 04/07/2020 1409   CO2 27 04/07/2020 1409   GLUCOSE 143 (H) 04/07/2020 1409   BUN 15 04/07/2020 1409   BUN 10 12/04/2018 1111   CREATININE 0.78 04/07/2020 1409   CALCIUM 9.8 04/07/2020 1409   PROT 7.5 04/07/2020 1409   ALBUMIN 4.2 04/07/2020 1409   AST 21 04/07/2020 1409   ALT 17 04/07/2020 1409   ALKPHOS 87 04/07/2020 1409   BILITOT 1.0 04/07/2020 1409   GFRNONAA >60 04/07/2020 1409   GFRAA 101 12/04/2018 1111      Latest Ref Rng & Units 04/07/2020    2:09 PM  Hepatic Function  Total Protein 6.5 - 8.1 g/dL 7.5   Albumin 3.5 - 5.0 g/dL 4.2   AST 15 - 41 U/L 21   ALT 0 - 44 U/L 17   Alk Phosphatase 38 - 126 U/L 87   Total Bilirubin 0.3 - 1.2 mg/dL 1.0       Current Medications:   Current Outpatient Medications (Endocrine & Metabolic):    calcitonin, salmon, (MIACALCIN/FORTICAL) 200 UNIT/ACT nasal spray,   Current Outpatient Medications (Cardiovascular):    atorvastatin (LIPITOR) 20 MG tablet, Take 20 mg by mouth once a week.   ezetimibe (ZETIA) 10 MG tablet,  Take 10 mg by mouth as needed.   lisinopril-hydrochlorothiazide (PRINZIDE,ZESTORETIC) 20-12.5 MG tablet, Take 0.5 tablets by mouth daily.   metoprolol succinate (TOPROL-XL) 25 MG 24 hr tablet, Take 0.5 tablets by mouth daily.   Current Outpatient Medications (Analgesics):    aspirin  EC 81 MG tablet, Take 1 tablet (81 mg total) by mouth daily. Swallow whole.   Current Outpatient Medications (Other):    AMBULATORY NON FORMULARY MEDICATION, Medication Name: Diltiazem 2%/Lidocaine  2%   Using your index finger apply a small amount of medication inside the anal opening and to the external anal area twice daily x 6 weeks.   Magnesium 400 MG TABS, Take 1 tablet by mouth daily.   Melatonin 3 MG TABS, Take 1 tablet by mouth at bedtime as needed (SLEEP).   Multiple Vitamin (MULTIVITAMIN)  tablet, Take 1 tablet by mouth daily.   Omega 3 1000 MG CAPS, Take 1 capsule by mouth daily.   Potassium 99 MG TABS, Take 1 tablet by mouth daily.   pregabalin (LYRICA) 50 MG capsule, Take by mouth.   pregabalin (LYRICA) 75 MG capsule, Take 75 mg by mouth 3 (three) times daily.   VITAMIN C-VITAMIN D-ZINC PO, Take 1 tablet by mouth daily.  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 States prior removal of polyp that "could have turned into cancer."   GERD (gastroesophageal reflux disease)    Hyperlipidemia    Hypertension    Hypertensive heart disease 06/04/2018   Internal hemorrhoids    OAB (overactive bladder) 11/01/2020   Primary osteoarthritis of left knee 12/11/2014   Rectal cancer (HCC)    Spinal stenosis of lumbar region 11/01/2020   Spondylolisthesis 11/01/2020   Allergies: No Known Allergies   Surgical History:  She  has a past surgical history that includes Cholecystectomy; Colon resection; Partial knee arthroplasty (Left); Eye surgery; Tubal ligation; Back surgery (02/2016); Colonoscopy (07/12/2016); Esophagogastroduodenoscopy (01/21/2003); and Replacement total knee (Right, 06/2018). Family History:  Her family history includes Breast cancer in her mother; Cancer in her sister; Diabetes in her father; Hypertension in her brother, mother, and sister; Thyroid  disease in her mother.  REVIEW OF SYSTEMS  : All other systems reviewed and negative except where noted in the History of Present Illness.  PHYSICAL EXAM: BP 120/78   Pulse 80   Ht 5\' 3"  (1.6 m)   Wt 200 lb (90.7 kg)   BMI 35.43 kg/m  Physical Exam   GENERAL APPEARANCE: Well nourished, in no apparent distress HEENT: No cervical lymphadenopathy, unremarkable thyroid , sclerae anicteric, conjunctiva  pink RESPIRATORY: Respiratory effort normal, BS equal bilateral without rales, rhonchi, wheezing CARDIO: RRR with no MRGs, peripheral pulses intact ABDOMEN: Soft, non distended, active bowel sounds in all 4 quadrants, no tenderness to palpation, no rebound, no mass appreciated RECTAL: Anus normal on inspection. Anterior anal fissure present. No rectal masses palpated. Rectal exam negative for blood. MUSCULOSKELETAL: Full ROM, normal gait, without edema SKIN: Dry, intact without rashes or lesions. No jaundice. NEURO: Alert, oriented, no focal deficits PSYCH: Cooperative, normal mood and affect.      Edmonia Gottron, PA-C 10:49 AM

## 2023-09-26 ENCOUNTER — Telehealth: Payer: Self-pay | Admitting: Physician Assistant

## 2023-09-26 NOTE — Telephone Encounter (Addendum)
 Lm on vm for patient to return call

## 2023-09-26 NOTE — Telephone Encounter (Signed)
 Patient recently here for an appt and was told she had a fissure.  She has been using the cream that was prescribed, but now since using it, she can't pee.  Please call patient and advise.  Thank you.

## 2023-09-26 NOTE — Telephone Encounter (Signed)
 Pt returned call. Pt was able to urinate today. Patient states that for the past 2 days she has experienced burning, pain, and itching with urination. Patient states that it hurts so bad that she balls up her fist to urinate. I advised patient to contact PCP since she may UA and culture to rule out UTI. Patient verbalized understanding and had no concerns at the end of the call.

## 2023-09-27 DIAGNOSIS — R110 Nausea: Secondary | ICD-10-CM | POA: Diagnosis not present

## 2023-09-27 DIAGNOSIS — N3000 Acute cystitis without hematuria: Secondary | ICD-10-CM | POA: Diagnosis not present

## 2023-10-12 DIAGNOSIS — N3000 Acute cystitis without hematuria: Secondary | ICD-10-CM | POA: Diagnosis not present

## 2023-10-20 DIAGNOSIS — E7800 Pure hypercholesterolemia, unspecified: Secondary | ICD-10-CM | POA: Diagnosis not present

## 2023-10-20 DIAGNOSIS — I480 Paroxysmal atrial fibrillation: Secondary | ICD-10-CM | POA: Diagnosis not present

## 2023-10-20 DIAGNOSIS — I10 Essential (primary) hypertension: Secondary | ICD-10-CM | POA: Diagnosis not present

## 2023-10-20 DIAGNOSIS — Z79899 Other long term (current) drug therapy: Secondary | ICD-10-CM | POA: Diagnosis not present

## 2023-11-07 ENCOUNTER — Encounter: Payer: Self-pay | Admitting: Gastroenterology

## 2023-11-15 ENCOUNTER — Encounter: Payer: Self-pay | Admitting: Gastroenterology

## 2023-11-15 ENCOUNTER — Ambulatory Visit: Admitting: Gastroenterology

## 2023-11-15 VITALS — BP 147/76 | HR 83 | Temp 97.3°F | Resp 20 | Ht 63.0 in | Wt 200.0 lb

## 2023-11-15 DIAGNOSIS — Z85048 Personal history of other malignant neoplasm of rectum, rectosigmoid junction, and anus: Secondary | ICD-10-CM

## 2023-11-15 DIAGNOSIS — K5520 Angiodysplasia of colon without hemorrhage: Secondary | ICD-10-CM | POA: Diagnosis not present

## 2023-11-15 DIAGNOSIS — K5900 Constipation, unspecified: Secondary | ICD-10-CM

## 2023-11-15 DIAGNOSIS — I4891 Unspecified atrial fibrillation: Secondary | ICD-10-CM | POA: Diagnosis not present

## 2023-11-15 DIAGNOSIS — K6289 Other specified diseases of anus and rectum: Secondary | ICD-10-CM

## 2023-11-15 DIAGNOSIS — Z860101 Personal history of adenomatous and serrated colon polyps: Secondary | ICD-10-CM

## 2023-11-15 DIAGNOSIS — K648 Other hemorrhoids: Secondary | ICD-10-CM | POA: Diagnosis not present

## 2023-11-15 DIAGNOSIS — K5730 Diverticulosis of large intestine without perforation or abscess without bleeding: Secondary | ICD-10-CM

## 2023-11-15 DIAGNOSIS — I10 Essential (primary) hypertension: Secondary | ICD-10-CM | POA: Diagnosis not present

## 2023-11-15 DIAGNOSIS — E669 Obesity, unspecified: Secondary | ICD-10-CM | POA: Diagnosis not present

## 2023-11-15 MED ORDER — SODIUM CHLORIDE 0.9 % IV SOLN: 500.0000 mL | INTRAVENOUS | Status: DC

## 2023-11-15 NOTE — Op Note (Signed)
 [facility] Patient Name: Caroline Osborne Procedure Date: 11/15/2023 9:23 AM MRN: [id number] Endoscopist: Lynnie Bring , MD, [id number] Age: 78 Referring MD:  Date of Birth: Aug 30, 1945 Gender: Female Account #: [id number] Procedure:                Flexible Sigmoidoscopy Indications:              #1. Anorectal sq cell Ca 12/2018 Stage IIIA (T2N1M0)                            s/p ChemoXRT (mitomycin -C/5FU). Neg CT                            chest/abdo/pelvis 01/2021 at [facility]. No recurrence on FS                            05/2021, 08/2022                           #2. H/O Anorectal Sq cell Ca-in-situ s/p transanal                            excision 09/28/2010. Bx-clear margins, HPV effect. Medicines:                Propofol  per Anesthesia Procedure:                Pre-Anesthesia Assessment:                           - Prior to the procedure, a History and Physical                            was performed, and patient medications and                            allergies were reviewed. The patient's tolerance of                            previous anesthesia was also reviewed. The risks                            and benefits of the procedure and the sedation                            options and risks were discussed with the patient.                            All questions were answered, and informed consent                            was obtained. Prior Anticoagulants: The patient has                            taken no anticoagulant or antiplatelet agents. ASA  Grade Assessment: III - A patient with severe                            systemic disease. After reviewing the risks and                            benefits, the patient was deemed in satisfactory                            condition to undergo the procedure.                           After obtaining informed consent, the scope was                            passed under direct vision. The PCF  Olympus Scope                            SN [id number] was introduced through the anus and                            advanced to the the px sigmoid colon (upto 40 cm,                            stopped due to stool). The flexible sigmoidoscopy                            was accomplished without difficulty. The patient                            tolerated the procedure well. The quality of the                            bowel preparation was good. Scope In: 9:36:41 AM Scope Out: 9:40:13 AM Total Procedure Duration: 0 hours 3 minutes 32 seconds  Findings:                 One channel of posterior thrombosed internal                            hemorrhoids were found during retroflexion and                            during perianal exam with surrounding scar tissue.                            No recurrence. Also noted was a healed posterior                            anal fissure.                           A few medium-mouthed diverticula were found in the  sigmoid colon.                           A few small angioectasias without bleeding were                            found in the mid rectum and in the distal rectum                            s/o mild radiation proctitis.                           The exam was otherwise without abnormality. The                            distention of the rectum was somewhat limited. Complications:            No immediate complications. Estimated Blood Loss:     Estimated blood loss: none. Impression:               - Non-bleeding internal hemorrhoids.                           - Mild radiation proctitis                           - No recurrence                           - Diverticulosis in the sigmoid colon.                           - The examination was otherwise normal.                           - No specimens collected. Recommendation:           - Discharge patient to home.                           - Avoid constipation.  Please continue MiraLAX 17 g                            p.o. daily along with fiber supplements.                           - If still with problems, add stool softeners.                           - The findings and recommendations were discussed                            with the patient's family. Lynnie Bring, MD 11/15/2023 9:50:07 AM This report has been signed electronically.

## 2023-11-15 NOTE — Progress Notes (Signed)
 Chief Complaint: FU  Referring Provider:  Gayl Males, MD      ASSESSMENT AND PLAN;   #1. 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 Neg CT chest/abdo/pelvis 01/2021 at [facility]  No recurrence on FS 05/2021 #2. 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 Bx-clear margins, HPV effect.  #3. Constipation #4. H/O colonic polyps 06/2016 small TAs. Last colon 12/2018 neg for polyps.  Plan: -Proceed with Flex Sig -X ray KUB 2V today -Miralax 17g po BID until a large BM, then QD     HPI:    Caroline Osborne is a 77 y.o. female  With history of A. Fib (not on AC, just baby ASA), HTN, HLD, DJD back For follow-up visit. Came back from a trip Had more problems with constipation Denies having any rectal bleeding but occasional rectal discomfort  Rectal exam did reveal softer stool in the rectal vault.  Heme-negative (in presence of Nabina).  No masses.  Dx with anorectal squamous cell carcinoma on colonoscopy 12/2018.  Subsequent work-up including CT/MRI revealed it to be stage IIIA.  She underwent chemo radiation with good results.  Negative CT chest Abdo/pelvis 01/09/2020, 09/2020 for any recurrence.  Had negative flexible sigmoidoscopy January 2023  Alt constipation and alt diarrhea. Here with constipation, pencil thin stools.  No melena or hematochezia.  No fever chills or night sweats.  She otherwise feels significantly better.  She has some lower abdominal discomfort which gets better with defecation.  Denies having any upper GI symptoms. Stopped taking Metamucil on her own.   Past GI procedures: Colonoscopy 01/02/2019 - Polypoid ulcerated lesion in the distal rectum involving the dentate line. Bx-squamous cell carcinoma.. - Mild sigmoid diverticulosis.  FS 04/2020 -One 4 mm polyp in the distal rectum, removed with a hot snare. Resected and retrieved. -Mild radiation proctitis. -Mild sigmoid  diverticulosis. -Scarring was noted in the anal canal and proximal rectum without any obvious masses.  FS 06/02/2021: neg  MRI pelvis 12/2018: 3.5 cm with extension through the muscularis propria.  Mesorectal lymph node measuring 5 mm.  CT chest Abdo/pelvis 12/2019 [facility]: neg  CT AP Sept 2, 2022 at [facility] -No evidence of recurrent or metastatic carcinoma within the abdomen or pelvis. -Small hiatal hernia. -Small uterine fibroid. -Aortic Atherosclerosis (ICD10-I70.0).   Past Medical History:  Diagnosis Date   Anal cancer (HCC) 01/06/2019   Arrhythmia    Arthritis    Atrial fibrillation (HCC) 06/04/2018   Chronic back pain    Colon polyp 11/01/2020   Formatting of this note might be different from the original. States prior removal of polyp that could have turned into cancer.   GERD (gastroesophageal reflux disease)    Hyperlipidemia    Hypertension    Hypertensive heart disease 06/04/2018   Internal hemorrhoids    OAB (overactive bladder) 11/01/2020   Primary osteoarthritis of left knee 12/11/2014   Rectal cancer [facility])    Spinal stenosis of lumbar region 11/01/2020   Spondylolisthesis 11/01/2020    Past Surgical History:  Procedure Laterality Date   BACK SURGERY  02/2016   L3/L4, L4/L5 laminotomy/decompression and fixation   CHOLECYSTECTOMY     COLON RESECTION     COLONOSCOPY  07/12/2016   Colonic polyp status post polypectomy. Mild sigmoid diverticulosis.    ESOPHAGOGASTRODUODENOSCOPY  01/21/2003   Small hiatal hernia. Mild gastroduodenitis. Small gastric polyps s/p polypectomy.    EYE SURGERY     PARTIAL KNEE ARTHROPLASTY  Left    REPLACEMENT TOTAL KNEE Right 06/2018   TUBAL LIGATION      Family History  Problem Relation Age of Onset   Hypertension Mother    Breast cancer Mother    Thyroid  disease Mother    Diabetes Father    Cancer Sister        all over    Hypertension Sister    Hypertension Brother    Colon cancer Neg Hx    Esophageal cancer Neg Hx     Rectal cancer Neg Hx    Stomach cancer Neg Hx     Social History   Tobacco Use   Smoking status: Never   Smokeless tobacco: Never  Vaping Use   Vaping status: Never Used  Substance Use Topics   Alcohol  use: Never   Drug use: Never    Current Outpatient Medications  Medication Sig Dispense Refill   aspirin  EC 81 MG tablet Take 1 tablet (81 mg total) by mouth daily. Swallow whole. 90 tablet 3   atorvastatin (LIPITOR) 20 MG tablet Take 20 mg by mouth once a week.     ezetimibe (ZETIA) 10 MG tablet Take 10 mg by mouth as needed.     gabapentin (NEURONTIN) 100 MG capsule Take 100 mg by mouth 3 (three) times daily.     lisinopril-hydrochlorothiazide (PRINZIDE,ZESTORETIC) 20-12.5 MG tablet Take 0.5 tablets by mouth daily.     Magnesium 400 MG TABS Take 1 tablet by mouth daily.     metoprolol succinate (TOPROL-XL) 25 MG 24 hr tablet Take 0.5 tablets by mouth daily.     Multiple Vitamin (MULTIVITAMIN) tablet Take 1 tablet by mouth daily.     Omega 3 1000 MG CAPS Take 1 capsule by mouth daily.     Potassium 99 MG TABS Take 1 tablet by mouth daily.     VITAMIN C-VITAMIN D-ZINC PO Take 1 tablet by mouth daily.     AMBULATORY NON FORMULARY MEDICATION Medication Name: Diltiazem 2%/Lidocaine  2%   Using your index finger apply a small amount of medication inside the anal opening and to the external anal area twice daily x 6 weeks. 30 g 1   calcitonin, salmon, (MIACALCIN/FORTICAL) 200 UNIT/ACT nasal spray      Melatonin 10 MG CAPS Take 10 mg by mouth.     Melatonin 3 MG TABS Take 1 tablet by mouth at bedtime as needed (SLEEP).     pregabalin (LYRICA) 50 MG capsule Take by mouth.     pregabalin (LYRICA) 75 MG capsule Take 75 mg by mouth 3 (three) times daily.     Current Facility-Administered Medications  Medication Dose Route Frequency Provider Last Rate Last Admin   0.9 %  sodium chloride  infusion  500 mL Intravenous Continuous Charlanne Groom, MD        No Known Allergies  Review of  Systems:  Constitutional: Denies fever, chills, diaphoresis, appetite change and fatigue.  Has OA     Physical Exam:    BP (!) 142/69   Pulse 91   Temp (!) 97.3 F (36.3 C)   Ht 5' 3 (1.6 m)   Wt 200 lb (90.7 kg)   SpO2 99%   BMI 35.43 kg/m  Filed Weights   11/15/23 0901  Weight: 200 lb (90.7 kg)   Gen: awake, alert, NAD, walking with a walker HEENT: anicteric, no pallor CV: RRR, no mrg Pulm: CTA b/l Abd: soft, NT/ND, +BS throughout Rectal examination: Performed in presence of Nabina.  No obvious lesions.  No anal stenosis.  Significant stool in the anal vault without obstruction.  Soft stool.  Stool brown heme neg Ext: no c/c/e Neuro: nonfocal   CBC:    Latest Ref Rng & Units 04/07/2020    2:09 PM 12/04/2018   11:11 AM  CBC  WBC 4.0 - 10.5 K/uL 7.2  7.5   Hemoglobin 12.0 - 15.0 g/dL 86.2  85.9   Hematocrit 36.0 - 46.0 % 42.9  42.0   Platelets 150 - 400 K/uL 237  250     CMP:    Latest Ref Rng & Units 04/07/2020    2:09 PM 12/04/2018   11:11 AM 06/04/2018    8:52 AM  CMP  Glucose 70 - 99 mg/dL 856  899  92   BUN 8 - 23 mg/dL 15  10  9    Creatinine 0.44 - 1.00 mg/dL 9.21  9.34  9.25   Sodium 135 - 145 mmol/L 135  142  139   Potassium 3.5 - 5.1 mmol/L 3.3  3.9  4.4   Chloride 98 - 111 mmol/L 97  100  98   CO2 22 - 32 mmol/L 27  24  25    Calcium 8.9 - 10.3 mg/dL 9.8  9.9  89.9   Total Protein 6.5 - 8.1 g/dL 7.5     Total Bilirubin 0.3 - 1.2 mg/dL 1.0     Alkaline Phos 38 - 126 U/L 87     AST 15 - 41 U/L 21     ALT 0 - 44 U/L 17         Anselm Bring, MD 11/15/2023, 9:30 AM  Cc: Gayl Males, MD

## 2023-11-15 NOTE — Patient Instructions (Addendum)
 Resume previous diet and medications - continue MiraLax 17g daily along with fiber supplements.    YOU HAD AN ENDOSCOPIC PROCEDURE TODAY AT THE [facility]:   Refer to the procedure report that was given to you for any specific questions about what was found during the examination.  If the procedure report does not answer your questions, please call your gastroenterologist to clarify.  If you requested that your care partner not be given the details of your procedure findings, then the procedure report has been included in a sealed envelope for you to review at your convenience later.  YOU SHOULD EXPECT: Some feelings of bloating in the abdomen. Passage of more gas than usual.  Walking can help get rid of the air that was put into your GI tract during the procedure and reduce the bloating. If you had a lower endoscopy (such as a colonoscopy or flexible sigmoidoscopy) you may notice spotting of blood in your stool or on the toilet paper. If you underwent a bowel prep for your procedure, you may not have a normal bowel movement for a few days.  Please Note:  You might notice some irritation and congestion in your nose or some drainage.  This is from the oxygen used during your procedure.  There is no need for concern and it should clear up in a day or so.  SYMPTOMS TO REPORT IMMEDIATELY:  Following lower endoscopy (colonoscopy or flexible sigmoidoscopy):  Excessive amounts of blood in the stool  Significant tenderness or worsening of abdominal pains  Swelling of the abdomen that is new, acute  Fever of 100F or higher  For urgent or emergent issues, a gastroenterologist can be reached at any hour by calling [phone]. Do not use MyChart messaging for urgent concerns.    DIET:  We do recommend a small meal at first, but then you may proceed to your regular diet.  Drink plenty of fluids but you should avoid alcoholic beverages for 24 hours.  ACTIVITY:  You should plan to take it  easy for the rest of today and you should NOT DRIVE or use heavy machinery until tomorrow (because of the sedation medicines used during the test).    FOLLOW UP: Our staff will call the number listed on your records the next business day following your procedure.  We will call around 7:15- 8:00 am to check on you and address any questions or concerns that you may have regarding the information given to you following your procedure. If we do not reach you, we will leave a message.     If any biopsies were taken you will be contacted by phone or by letter within the next 1-3 weeks.  Please call us  at [phone] if you have not heard about the biopsies in 3 weeks.    SIGNATURES/CONFIDENTIALITY: You and/or your care partner have signed paperwork which will be entered into your electronic medical record.  These signatures attest to the fact that that the information above on your After Visit Summary has been reviewed and is understood.  Full responsibility of the confidentiality of this discharge information lies with you and/or your care-partner.

## 2023-11-15 NOTE — Progress Notes (Signed)
 Pt's states no medical or surgical changes since previsit or office visit.

## 2023-11-15 NOTE — Progress Notes (Signed)
 Report to PACU, RN, vss, BBS= Clear.

## 2023-11-19 DIAGNOSIS — N3000 Acute cystitis without hematuria: Secondary | ICD-10-CM | POA: Diagnosis not present

## 2023-11-19 DIAGNOSIS — I10 Essential (primary) hypertension: Secondary | ICD-10-CM | POA: Diagnosis not present

## 2023-11-19 DIAGNOSIS — Z79899 Other long term (current) drug therapy: Secondary | ICD-10-CM | POA: Diagnosis not present

## 2023-11-19 DIAGNOSIS — I4891 Unspecified atrial fibrillation: Secondary | ICD-10-CM | POA: Diagnosis not present

## 2023-11-19 DIAGNOSIS — E7800 Pure hypercholesterolemia, unspecified: Secondary | ICD-10-CM | POA: Diagnosis not present

## 2023-12-06 DIAGNOSIS — Z6834 Body mass index (BMI) 34.0-34.9, adult: Secondary | ICD-10-CM | POA: Diagnosis not present

## 2023-12-06 DIAGNOSIS — I700 Atherosclerosis of aorta: Secondary | ICD-10-CM | POA: Diagnosis not present

## 2023-12-06 DIAGNOSIS — M545 Low back pain, unspecified: Secondary | ICD-10-CM | POA: Diagnosis not present

## 2023-12-06 DIAGNOSIS — I10 Essential (primary) hypertension: Secondary | ICD-10-CM | POA: Diagnosis not present

## 2023-12-06 DIAGNOSIS — G729 Myopathy, unspecified: Secondary | ICD-10-CM | POA: Diagnosis not present

## 2023-12-06 DIAGNOSIS — R7301 Impaired fasting glucose: Secondary | ICD-10-CM | POA: Diagnosis not present

## 2023-12-06 DIAGNOSIS — Z85038 Personal history of other malignant neoplasm of large intestine: Secondary | ICD-10-CM | POA: Diagnosis not present

## 2023-12-06 DIAGNOSIS — Z9181 History of falling: Secondary | ICD-10-CM | POA: Diagnosis not present

## 2023-12-06 DIAGNOSIS — E7800 Pure hypercholesterolemia, unspecified: Secondary | ICD-10-CM | POA: Diagnosis not present

## 2023-12-06 DIAGNOSIS — E669 Obesity, unspecified: Secondary | ICD-10-CM | POA: Diagnosis not present

## 2023-12-20 DIAGNOSIS — E7800 Pure hypercholesterolemia, unspecified: Secondary | ICD-10-CM | POA: Diagnosis not present

## 2023-12-20 DIAGNOSIS — I480 Paroxysmal atrial fibrillation: Secondary | ICD-10-CM | POA: Diagnosis not present

## 2023-12-20 DIAGNOSIS — I10 Essential (primary) hypertension: Secondary | ICD-10-CM | POA: Diagnosis not present

## 2023-12-20 DIAGNOSIS — Z79899 Other long term (current) drug therapy: Secondary | ICD-10-CM | POA: Diagnosis not present

## 2023-12-24 NOTE — Progress Notes (Unsigned)
 Cardiology Office Note:    Date:  12/28/2023   ID:  Caroline Osborne, DOB 1945-07-30, MRN [id number]  PCP:  Gayl Males, MD  Cardiologist:  Redell Leiter, MD    Referring MD: Gayl Males, MD    ASSESSMENT:    1. Chest pain of uncertain etiology   2. Paroxysmal atrial fibrillation (HCC)   3. Hypertensive heart disease without heart failure   4. Hyperlipidemia, unspecified hyperlipidemia type    PLAN:    In order of problems listed above:  Although seen in follow-up for atrial arrhythmia she has a new problem that is consistent with possible probable angina.  The pattern is progressed after discussion of diagnostic modality she will undergo cardiac CTA at the med center in [place] follow-up in the office in 6 weeks I will give her a new prescription for nitroglycerin  she can take as needed and continue aspirin  beta-blocker and nonstatin No recurrent atrial fibrillation she is not anticoagulated her choice continue her beta-blocker Hypertension is well-controlled continue treatment including ACE thiazide diuretic She will continue her nonstatin if she has CAD will need to consider retrial of statins or bempedoic acid   Next appointment: 6 to 8 weeks to follow-up for CTA if unremarkable we will cancel the appointment   Medication Adjustments/Labs and Tests Ordered: Current medicines are reviewed at length with the patient today.  Concerns regarding medicines are outlined above.  Orders Placed This Encounter  Procedures   EKG 12-Lead   No orders of the defined types were placed in this encounter.    History of Present Illness:    Caroline Osborne is a 78 y.o. female with a hx of paroxysmal atrial fibrillation hypertension hyperlipidemia her CHA2DS2-VASc score was 4 and she declined anticoagulant therapy last seen 11/30/2020.  Compliance with diet, lifestyle and medications: Yes  She has developed a new problem where she gets discomfort in the left  chest she describes it as severe squeezing happens a couple times a week lasting 10 minutes up to an hour with and without activity relieved with rest and on 1 occasion she took old nitroglycerin  with relief She is concerned that she has CAD No associated palpitation edema shortness of breath or syncope but she had quite a fall at home 1 day that she attributes to neuropathy. She had labs done in her PCP office 12/06/2023 cholesterol 219 LDL 125 CBC was normal creatinine 0.76 potassium 3.9 He is on nonstatin lipid-lowering therapy Zetia Past Medical History:  Diagnosis Date   Anal cancer (HCC) 01/06/2019   Arrhythmia    Arthritis    Atrial fibrillation (HCC) 06/04/2018   Chronic back pain    Colon polyp 11/01/2020   Formatting of this note might be different from the original. States prior removal of polyp that could have turned into cancer.   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     History of colonic polyps 08/29/2022   Hyperlipidemia    Hypertension    Hypertensive heart disease 06/04/2018   Internal hemorrhoids    OAB (overactive bladder) 11/01/2020   Preop examination 01/20/2015   Preoperative cardiovascular examination 06/03/2018   Primary osteoarthritis of left knee 12/11/2014   Rectal cancer (HCC)    Spinal stenosis of lumbar region 11/01/2020   Spondylolisthesis 11/01/2020    Current Medications: Current Meds  Medication Sig   AMBULATORY NON FORMULARY MEDICATION Medication Name: Diltiazem  2%/Lidocaine  2%   Using your index finger apply a small amount of medication inside the anal opening and to the external anal area twice daily x 6 weeks.   aspirin  EC 81 MG tablet Take 1 tablet (81 mg total) by mouth daily. Swallow whole.   Coenzyme Q10 (COQ10) 100 MG CAPS Take 100 mg by mouth daily.   ezetimibe (ZETIA) 10 MG tablet Take 10 mg by mouth daily.   gabapentin  (NEURONTIN) 100 MG capsule Take 100 mg by mouth as needed (pain).   lisinopril-hydrochlorothiazide (PRINZIDE,ZESTORETIC) 20-12.5 MG tablet Take 0.5 tablets by mouth daily.   Magnesium 400 MG TABS Take 1 tablet by mouth daily.   Melatonin 3 MG TABS Take 1 tablet by mouth at bedtime as needed (SLEEP).   metoprolol  succinate (TOPROL -XL) 25 MG 24 hr tablet Take 0.5 tablets by mouth daily.   Omega 3 1000 MG CAPS Take 1 capsule by mouth daily.   VITAMIN C-VITAMIN D-ZINC PO Take 1 tablet by mouth daily.   [DISCONTINUED] Melatonin 10 MG CAPS Take 10 mg by mouth.      EKGs/Labs/Other Studies Reviewed:    The following studies were reviewed today:  EKG Interpretation Date/Time:  Friday December 28 2023 09:37:35 EDT Ventricular Rate:  81 PR Interval:  160 QRS Duration:  80 QT Interval:  368 QTC Calculation: 427 R Axis:   -7  Text Interpretation: Normal sinus rhythm Moderate voltage criteria for LVH, may be normal variant Abnormal ECG No previous ECGs available Confirmed by Monetta Rogue ([id number]) on 12/28/2023 9:41:18 AM     Physical Exam:    VS:  BP 138/70   Pulse 81   Ht 5' 6 (1.676 m)   Wt 203 lb 9.6 oz (92.4 kg)   SpO2 95%   BMI 32.86 kg/m     Wt Readings from Last 3 Encounters:  12/28/23 203 lb 9.6 oz (92.4 kg)  11/15/23 200 lb (90.7 kg)  09/24/23 200 lb (90.7 kg)     GEN:  Well nourished, well developed in no acute distress HEENT: Normal NECK: No JVD; No carotid bruits LYMPHATICS: No lymphadenopathy CARDIAC: RRR, no murmurs, rubs, gallops RESPIRATORY:  Clear to auscultation without rales, wheezing or rhonchi  ABDOMEN: Soft, non-tender, non-distended MUSCULOSKELETAL:  No edema; No deformity  SKIN: Warm and dry NEUROLOGIC:  Alert and oriented x 3 PSYCHIATRIC:  Normal affect    Signed, Rogue Monetta, MD  12/28/2023 10:02 AM    [facility]

## 2023-12-26 ENCOUNTER — Telehealth: Payer: Self-pay | Admitting: Gastroenterology

## 2023-12-26 NOTE — Telephone Encounter (Signed)
 error

## 2023-12-27 DIAGNOSIS — I499 Cardiac arrhythmia, unspecified: Secondary | ICD-10-CM | POA: Insufficient documentation

## 2023-12-27 DIAGNOSIS — K219 Gastro-esophageal reflux disease without esophagitis: Secondary | ICD-10-CM | POA: Insufficient documentation

## 2023-12-27 DIAGNOSIS — C20 Malignant neoplasm of rectum: Secondary | ICD-10-CM | POA: Insufficient documentation

## 2023-12-27 DIAGNOSIS — G8929 Other chronic pain: Secondary | ICD-10-CM | POA: Insufficient documentation

## 2023-12-27 DIAGNOSIS — K648 Other hemorrhoids: Secondary | ICD-10-CM | POA: Insufficient documentation

## 2023-12-28 ENCOUNTER — Ambulatory Visit: Attending: Cardiology | Admitting: Cardiology

## 2023-12-28 ENCOUNTER — Encounter: Payer: Self-pay | Admitting: Cardiology

## 2023-12-28 VITALS — BP 138/70 | HR 81 | Ht 66.0 in | Wt 203.6 lb

## 2023-12-28 DIAGNOSIS — E785 Hyperlipidemia, unspecified: Secondary | ICD-10-CM

## 2023-12-28 DIAGNOSIS — R072 Precordial pain: Secondary | ICD-10-CM

## 2023-12-28 DIAGNOSIS — I119 Hypertensive heart disease without heart failure: Secondary | ICD-10-CM | POA: Diagnosis not present

## 2023-12-28 DIAGNOSIS — R079 Chest pain, unspecified: Secondary | ICD-10-CM | POA: Diagnosis not present

## 2023-12-28 DIAGNOSIS — I480 Paroxysmal atrial fibrillation: Secondary | ICD-10-CM

## 2023-12-28 MED ORDER — METOPROLOL TARTRATE 100 MG PO TABS: 100.0000 mg | ORAL_TABLET | Freq: Once | ORAL | 0 refills | Status: DC

## 2023-12-28 MED ORDER — NITROGLYCERIN 0.4 MG SL SUBL: 0.4000 mg | SUBLINGUAL_TABLET | SUBLINGUAL | 1 refills | Status: AC | PRN

## 2023-12-28 NOTE — Patient Instructions (Signed)
 Medication Instructions:  Your physician has recommended you make the following change in your medication:   START: Nitroglycerin  0.4 mg under the tongue every 5 minutes x 3 as needed for chest pain.  *If you need a refill on your cardiac medications before your next appointment, please call your pharmacy*  Lab Work: Your physician recommends that you return for lab work in:   Labs today: BMP  If you have labs (blood work) drawn today and your tests are completely normal, you will receive your results only by: MyChart Message (if you have MyChart) OR A paper copy in the mail If you have any lab test that is abnormal or we need to change your treatment, we will call you to review the results.  Testing/Procedures:   Your cardiac CT will be scheduled at one of the below locations:   [facility] [address] [place], [place] [address] [phone]  OR   [facility] [address] [place], [place] [address] [phone]  OR   [facility] [address] [place], [place] [address] [phone]  OR   [facility] and Vascular Tower [address]  [place], [place] [address]  OR   [facility] [address] [place], [place]  If scheduled at [facility], please arrive at the [facility] and Children's Entrance (Entrance C2) of [facility] 30 minutes prior to test start time. You can use the FREE valet parking offered at entrance C (encouraged to control the heart rate for the test)  Proceed to the [facility] Radiology Department (first floor) to check-in and test prep.  All radiology patients and guests should use entrance C2 at [facility], accessed from [facility], even though the hospital's physical address listed is [address].  If scheduled at the Heart and Vascular Tower at [address], please enter the parking lot using the [address]  entrance and use the FREE valet service at the patient drop-off area. Enter the building and check-in with registration on the main floor.  If scheduled at [facility], please arrive to the Heart and Vascular Center 15 mins early for check-in and test prep.  There is spacious parking and easy access to the radiology department from the [facility] Heart and Vascular entrance. Please enter here and check-in with the desk attendant.   If scheduled at [facility], please arrive 30 minutes early for check-in and test prep.  Please follow these instructions carefully (unless otherwise directed):   On the Night Before the Test: Be sure to Drink plenty of water. Do not consume any caffeinated/decaffeinated beverages or chocolate 12 hours prior to your test. Do not take any antihistamines 12 hours prior to your test.  On the Day of the Test: Drink plenty of water until 1 hour prior to the test. Do not eat any food 1 hour prior to test. You may take your regular medications prior to the test.  Take metoprolol  (Lopressor ) two hours prior to test. If you take Lisinopril - Hydrochlorothiazide please HOLD on the morning of the test. Patients who wear a continuous glucose monitor MUST remove the device prior to scanning. FEMALES- please wear underwire-free bra if available, avoid dresses & tight clothing      After the Test: Drink plenty of water. After receiving IV contrast, you may experience a mild flushed feeling. This is normal. On occasion, you may experience a mild  rash up to 24 hours after the test. This is not dangerous. If this occurs, you can take Benadryl 25 mg, Zyrtec, Claritin, or Allegra and increase your fluid intake. (Patients taking Tikosyn should avoid Benadryl, and may take Zyrtec, Claritin, or Allegra) If you experience trouble breathing, this can be serious. If it is severe call 911 IMMEDIATELY. If it is mild, please call our office.  We will call to  schedule your test 2-4 weeks out understanding that some insurance companies will need an authorization prior to the service being performed.   For more information and frequently asked questions, please visit our website : [email or web address]  For non-scheduling related questions, please contact the cardiac imaging nurse navigator should you have any questions/concerns: Cardiac Imaging Nurse Navigators Direct Office Dial: [phone]   For scheduling needs, including cancellations and rescheduling, please call [place], [phone].   Follow-Up: At [facility], you and your health needs are our priority.  As part of our continuing mission to provide you with exceptional heart care, our providers are all part of one team.  This team includes your primary Cardiologist (physician) and Advanced Practice Providers or APPs (Physician Assistants and Nurse Practitioners) who all work together to provide you with the care you need, when you need it.  Your next appointment:   6 week(s)  Provider:   Redell Leiter, MD    We recommend signing up for the patient portal called MyChart.  Sign up information is provided on this After Visit Summary.  MyChart is used to connect with patients for Virtual Visits (Telemedicine).  Patients are able to view lab/test results, encounter notes, upcoming appointments, etc.  Non-urgent messages can be sent to your provider as well.   To learn more about what you can do with MyChart, go to [email or web address].   Other Instructions None

## 2023-12-29 LAB — BASIC METABOLIC PANEL WITH GFR
BUN/Creatinine Ratio: 13 (ref 12–28)
BUN: 10 mg/dL (ref 8–27)
CO2: 23 mmol/L (ref 20–29)
Calcium: 10 mg/dL (ref 8.7–10.3)
Chloride: 97 mmol/L (ref 96–106)
Creatinine, Ser: 0.75 mg/dL (ref 0.57–1.00)
Glucose: 90 mg/dL (ref 70–99)
Potassium: 4.1 mmol/L (ref 3.5–5.2)
Sodium: 139 mmol/L (ref 134–144)
eGFR: 81 mL/min/1.73 (ref >59)

## 2023-12-30 ENCOUNTER — Ambulatory Visit: Payer: Self-pay | Admitting: Cardiology

## 2024-01-20 DIAGNOSIS — I10 Essential (primary) hypertension: Secondary | ICD-10-CM | POA: Diagnosis not present

## 2024-01-20 DIAGNOSIS — E7800 Pure hypercholesterolemia, unspecified: Secondary | ICD-10-CM | POA: Diagnosis not present

## 2024-01-20 DIAGNOSIS — Z79899 Other long term (current) drug therapy: Secondary | ICD-10-CM | POA: Diagnosis not present

## 2024-02-06 ENCOUNTER — Telehealth (HOSPITAL_COMMUNITY): Payer: Self-pay | Admitting: *Deleted

## 2024-02-06 NOTE — Telephone Encounter (Signed)
 Attempted to call patient regarding upcoming cardiac CT appointment. Left message on voicemail with name and callback number Sid Seats RN Navigator Cardiac Imaging [facility] Vascular Services [phone] Office

## 2024-02-06 NOTE — Telephone Encounter (Signed)
 Received call from patient regarding upcoming cardiac imaging study; pt verbalizes understanding of appt date/time, parking situation and where to check in, pre-test NPO status and medications ordered, and verified current allergies; name and call back number provided for further questions should they arise Sid Seats RN Navigator Cardiac Imaging [facility] [phone] office [phone] cell

## 2024-02-07 ENCOUNTER — Ambulatory Visit (INDEPENDENT_AMBULATORY_CARE_PROVIDER_SITE_OTHER)
Admission: RE | Admit: 2024-02-07 | Discharge: 2024-02-07 | Disposition: A | Discharge status: Home / Self Care | Source: Ambulatory Visit | Attending: Cardiology | Admitting: Cardiology

## 2024-02-07 DIAGNOSIS — R072 Precordial pain: Secondary | ICD-10-CM

## 2024-02-07 DIAGNOSIS — I2510 Atherosclerotic heart disease of native coronary artery without angina pectoris: Secondary | ICD-10-CM | POA: Diagnosis not present

## 2024-02-07 MED ORDER — NITROGLYCERIN 0.4 MG SL SUBL
0.8000 mg | SUBLINGUAL_TABLET | Freq: Once | SUBLINGUAL | Status: AC
  Administered 2024-02-07: 0.8 mg via SUBLINGUAL

## 2024-02-07 MED ORDER — IOHEXOL 350 MG/ML SOLN
100.0000 mL | Freq: Once | INTRAVENOUS | Status: AC | PRN
  Administered 2024-02-07: 100 mL via INTRAVENOUS

## 2024-02-08 NOTE — Progress Notes (Signed)
 Attempted to call the patient. Patient did not answer the phone and the phone had a busy signal.

## 2024-02-11 NOTE — Progress Notes (Signed)
 Left message for the patient to call back.

## 2024-02-11 NOTE — Progress Notes (Unsigned)
 Cardiology Office Note:    Date:  02/13/2024   ID:  Caroline Osborne, DOB Nov 11, 1945, MRN [id number]  PCP:  Gayl Males, MD  Cardiologist:  Redell Leiter, MD    Referring MD: Gayl Males, MD    ASSESSMENT:    1. Coronary artery disease of native artery of native heart with stable angina pectoris   2. Hypertensive heart disease without heart failure   3. Mixed hyperlipidemia   4. Agatston coronary artery calcium score between 100 and 199    PLAN:    In order of problems listed above:  Overall doing well no recurrent atrial fibrillation or angina she has opted not to be anticoagulated she will continue aspirin  beta-blocker Zetia and try a low-dose of a low intensity statin 3 days a week for lipid-lowering effect. Stable hypertension continue ACE inhibitor thiazide diuretic   Next appointment: 1 year   Medication Adjustments/Labs and Tests Ordered: Current medicines are reviewed at length with the patient today.  Concerns regarding medicines are outlined above.  No orders of the defined types were placed in this encounter.  No orders of the defined types were placed in this encounter.    History of Present Illness:    Caroline Osborne is a 78 y.o. female with a hx of  paroxysmal atrial fibrillation hypertension hyperlipidemia  last seen 12/28/2023 for chest pain.  CTA with an elevated calcium score of 167 and moderate nonflow-limiting stenosis of a small caliber nondominant right coronary artery.  Compliance with diet, lifestyle and medications: Yes  She is doing better and is having no anginal discomfort and is taking low-dose aspirin  beta-blocker and Zetia. She asked me about nonstatin lipid-lowering therapy considering an over-the-counter and instead were going to try a low-dose of the low potency statin 3 days a week and I think she will get a good result.  Her previous profile in January had a cholesterol of 210 HDL 64 and LDL 76 Past  Medical History:  Diagnosis Date   Anal cancer (HCC) 01/06/2019   Arrhythmia    Arthritis    Atrial fibrillation (HCC) 06/04/2018   Chronic back pain    Colon polyp 11/01/2020   Formatting of this note might be different from the original. States prior removal of polyp that could have turned into cancer.   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     History of colonic polyps 08/29/2022   Hyperlipidemia    Hypertension    Hypertensive heart disease 06/04/2018   Internal hemorrhoids    OAB (overactive bladder) 11/01/2020   Preop examination 01/20/2015   Preoperative cardiovascular examination 06/03/2018   Primary osteoarthritis of left knee 12/11/2014   Rectal cancer (HCC)    Spinal stenosis of lumbar region 11/01/2020   Spondylolisthesis 11/01/2020    Current Medications: Current Meds  Medication Sig   AMBULATORY NON FORMULARY MEDICATION Medication Name: Diltiazem 2%/Lidocaine  2%   Using your index finger apply a small amount of medication inside the anal opening and to the external anal area twice daily x 6 weeks.   aspirin  EC 81 MG tablet Take 1 tablet (81 mg total) by mouth daily. Swallow whole.   Coenzyme Q10 (COQ10) 100 MG CAPS Take 100 mg by mouth daily.   ezetimibe (ZETIA) 10 MG tablet Take 10 mg by mouth daily.   gabapentin (NEURONTIN) 100 MG capsule Take 100 mg by mouth as  needed (pain).   lisinopril-hydrochlorothiazide (PRINZIDE,ZESTORETIC) 20-12.5 MG tablet Take 0.5 tablets by mouth daily.   Magnesium 400 MG TABS Take 1 tablet by mouth daily.   Melatonin 3 MG TABS Take 1 tablet by mouth at bedtime as needed (SLEEP).   metoprolol  succinate (TOPROL -XL) 25 MG 24 hr tablet Take 0.5 tablets by mouth daily.   nitroGLYCERIN  (NITROSTAT ) 0.4 MG SL tablet Place 1 tablet (0.4 mg total) under the tongue every 5 (five) minutes as needed for chest pain.   Omega 3 1000 MG CAPS  Take 1 capsule by mouth daily.   VITAMIN C-VITAMIN D-ZINC PO Take 1 tablet by mouth daily.      EKGs/Labs/Other Studies Reviewed:    The following studies were reviewed today:  Cardiac Studies & Procedures   ______________________________________________________________________________________________          CT SCANS  CT CORONARY MORPH W/CTA COR W/SCORE 02/07/2024  Addendum 02/10/2024  4:02 PM ADDENDUM REPORT: 02/10/2024 16:00  EXAM: OVER-READ INTERPRETATION  CT CHEST  The following report is an over-read performed by radiologist Dr. Ree Levy [facility] on 02/10/2024. This over-read does not include interpretation of cardiac or coronary anatomy or pathology. The coronary calcium score interpretation by the cardiologist is attached.  COMPARISON:  None.  FINDINGS: Mediastinum/Nodes: No solid / cystic mediastinal masses. The visualized esophagus is nondistended precluding optimal assessment. No thoracic lymphadenopathy by size criteria.  Lungs/Pleura: Imaged tracheo-bronchial tree is patent. There are patchy areas of linear, plate-like atelectasis and/or scarring throughout bilateral lungs. No mass or consolidation. No pleural effusion or pneumothorax. No suspicious lung nodules.  Upper Abdomen: There is a moderate-sized hiatal hernia. Surgically absent gallbladder. Mildly dilated visualized extrahepatic bile duct, likely secondary to postcholecystectomy status. Remaining visualized upper abdominal viscera within normal limits.  Musculoskeletal: The visualized soft tissues of the chest wall are grossly unremarkable. No suspicious osseous lesions.  IMPRESSION: No acute or significant extracardiac findings.   Electronically Signed By: Ree Molt M.D. On: 02/10/2024 16:00  Narrative CLINICAL DATA:  CP  EXAM: Cardiac/Coronary  CTA  TECHNIQUE: The patient was scanned on a GE Apex scanner.  FINDINGS: A 120 kV prospective scan was  triggered in the descending thoracic aorta at 111 HU's. Axial non-contrast 3 mm slices were carried out through the heart. The data set was analyzed on a dedicated work station and scored using the Agatson method. Gantry rotation speed was 250 msecs and collimation was .6 mm. No beta blockade and 0.8 mg of sl NTG was given. The 3D data set was reconstructed at 76% of the R-R cycle. Diastolic phases were analyzed on a dedicated work station using MPR, MIP and VRT modes. The patient received 80 cc of contrast.  Aorta: Normal size.  Moderate calcifications.  No dissection.  Aortic Valve:  Trileaflet.  No calcifications.  Mild mitral annulus calcifications noted.  Coronary Arteries:  Normal coronary origin.  Left dominance.  RCA is a small, non dominant artery. This artery originates from right sinus of Valsalva but close to left sinus of Valsalva. It makes sharp turn anteriorly. Narrowing moderate in degree 50-69% is noted at the orifice of this artery  Left main is a large artery that gives rise to LAD and LCX arteries.  LAD is a large vessel that has few, mostly calcified plaques in the mid section with mild stenosis of 25-49%. This artery gives rise to small D1 and D2.  LCX is a large, dominant artery that gives rise to one large OM1 branch as  well as PDA and PLA. There is no plaque.  Other findings:  Normal pulmonary vein drainage into the left atrium.  Normal left atrial appendage without a thrombus.  Normal size of the pulmonary artery.  IMPRESSION: 1. Coronary calcium score of 167. This was 70 percentile for age and sex matched control.  2. Normal coronary origin with left dominance.  3. CAD-RADS 3. Moderate stenosis. (50-69%) orifice of small, non dominant RCA. Consider symptom-guided anti-ischemic pharmacotherapy as well as risk factor modification per guideline directed care.  Electronically Signed: By: Lamar Fitch M.D. On: 02/07/2024 12:54      ______________________________________________________________________________________________          Recent Labs: 12/28/2023: BUN 10; Creatinine, Ser 0.75; Potassium 4.1; Sodium 139  Recent Lipid Panel No results found for: CHOL, TRIG, HDL, CHOLHDL, VLDL, LDLCALC, LDLDIRECT  Physical Exam:    VS:  BP 116/68   Pulse 100   Ht 5' 4 (1.626 m)   Wt 205 lb 3.2 oz (93.1 kg)   SpO2 95%   BMI 35.22 kg/m     Wt Readings from Last 3 Encounters:  02/13/24 205 lb 3.2 oz (93.1 kg)  12/28/23 203 lb 9.6 oz (92.4 kg)  11/15/23 200 lb (90.7 kg)     GEN:  Well nourished, well developed in no acute distress HEENT: Normal NECK: No JVD; No carotid bruits LYMPHATICS: No lymphadenopathy CARDIAC: RRR, no murmurs, rubs, gallops RESPIRATORY:  Clear to auscultation without rales, wheezing or rhonchi  ABDOMEN: Soft, non-tender, non-distended MUSCULOSKELETAL:  No edema; No deformity  SKIN: Warm and dry NEUROLOGIC:  Alert and oriented x 3 PSYCHIATRIC:  Normal affect    Signed, Redell Leiter, MD  02/13/2024 1:51 PM    [facility]

## 2024-02-12 ENCOUNTER — Telehealth: Payer: Self-pay | Admitting: Cardiology

## 2024-02-12 ENCOUNTER — Other Ambulatory Visit: Payer: Self-pay

## 2024-02-12 NOTE — Telephone Encounter (Signed)
 Patient requests a call back to review CT results.

## 2024-02-12 NOTE — Telephone Encounter (Signed)
 Patient informed of the results of her cardiac CTA. Instead of starting new medication today, the patient stated that she would like to discuss her cardiac CTA results and the next steps with Dr. Monetta during her appointment with him tomorrow on 02/13/24. Patient had no further questions at this time.

## 2024-02-13 ENCOUNTER — Encounter: Payer: Self-pay | Admitting: Cardiology

## 2024-02-13 ENCOUNTER — Ambulatory Visit: Attending: Cardiology | Admitting: Cardiology

## 2024-02-13 VITALS — BP 116/68 | HR 100 | Ht 64.0 in | Wt 205.2 lb

## 2024-02-13 DIAGNOSIS — E7800 Pure hypercholesterolemia, unspecified: Secondary | ICD-10-CM | POA: Diagnosis not present

## 2024-02-13 DIAGNOSIS — I25118 Atherosclerotic heart disease of native coronary artery with other forms of angina pectoris: Secondary | ICD-10-CM | POA: Diagnosis not present

## 2024-02-13 DIAGNOSIS — F5101 Primary insomnia: Secondary | ICD-10-CM | POA: Diagnosis not present

## 2024-02-13 DIAGNOSIS — I10 Essential (primary) hypertension: Secondary | ICD-10-CM | POA: Diagnosis not present

## 2024-02-13 DIAGNOSIS — Z23 Encounter for immunization: Secondary | ICD-10-CM | POA: Diagnosis not present

## 2024-02-13 DIAGNOSIS — R931 Abnormal findings on diagnostic imaging of heart and coronary circulation: Secondary | ICD-10-CM

## 2024-02-13 DIAGNOSIS — Z1389 Encounter for screening for other disorder: Secondary | ICD-10-CM | POA: Diagnosis not present

## 2024-02-13 DIAGNOSIS — Z6835 Body mass index (BMI) 35.0-35.9, adult: Secondary | ICD-10-CM | POA: Diagnosis not present

## 2024-02-13 DIAGNOSIS — C210 Malignant neoplasm of anus, unspecified: Secondary | ICD-10-CM | POA: Diagnosis not present

## 2024-02-13 DIAGNOSIS — I119 Hypertensive heart disease without heart failure: Secondary | ICD-10-CM

## 2024-02-13 DIAGNOSIS — Z0000 Encounter for general adult medical examination without abnormal findings: Secondary | ICD-10-CM | POA: Diagnosis not present

## 2024-02-13 DIAGNOSIS — E782 Mixed hyperlipidemia: Secondary | ICD-10-CM | POA: Diagnosis not present

## 2024-02-13 MED ORDER — PRAVASTATIN SODIUM 20 MG PO TABS: 20.0000 mg | ORAL_TABLET | ORAL | 3 refills | Status: AC

## 2024-02-13 NOTE — Patient Instructions (Signed)
 Medication Instructions:  Your physician has recommended you make the following change in your medication:   START: Pravastatin  20 mg every Monday - Wednesday - Friday  *If you need a refill on your cardiac medications before your next appointment, please call your pharmacy*  Lab Work: None If you have labs (blood work) drawn today and your tests are completely normal, you will receive your results only by: MyChart Message (if you have MyChart) OR A paper copy in the mail If you have any lab test that is abnormal or we need to change your treatment, we will call you to review the results.  Testing/Procedures: None  Follow-Up: At [facility], you and your health needs are our priority.  As part of our continuing mission to provide you with exceptional heart care, our providers are all part of one team.  This team includes your primary Cardiologist (physician) and Advanced Practice Providers or APPs (Physician Assistants and Nurse Practitioners) who all work together to provide you with the care you need, when you need it.  Your next appointment:   1 year(s)  Provider:   Redell Leiter, MD    We recommend signing up for the patient portal called MyChart.  Sign up information is provided on this After Visit Summary.  MyChart is used to connect with patients for Virtual Visits (Telemedicine).  Patients are able to view lab/test results, encounter notes, upcoming appointments, etc.  Non-urgent messages can be sent to your provider as well.   To learn more about what you can do with MyChart, go to [email or web address].   Other Instructions None

## 2024-02-13 NOTE — Addendum Note (Signed)
 Addended by: SHERRE ADE I on: 02/13/2024 02:06 PM   Modules accepted: Orders

## 2024-02-19 DIAGNOSIS — I480 Paroxysmal atrial fibrillation: Secondary | ICD-10-CM | POA: Diagnosis not present

## 2024-02-19 DIAGNOSIS — I10 Essential (primary) hypertension: Secondary | ICD-10-CM | POA: Diagnosis not present

## 2024-02-19 DIAGNOSIS — Z79899 Other long term (current) drug therapy: Secondary | ICD-10-CM | POA: Diagnosis not present

## 2024-02-19 DIAGNOSIS — E7800 Pure hypercholesterolemia, unspecified: Secondary | ICD-10-CM | POA: Diagnosis not present

## 2024-03-21 DIAGNOSIS — E7800 Pure hypercholesterolemia, unspecified: Secondary | ICD-10-CM | POA: Diagnosis not present

## 2024-03-21 DIAGNOSIS — I10 Essential (primary) hypertension: Secondary | ICD-10-CM | POA: Diagnosis not present

## 2024-03-21 DIAGNOSIS — M542 Cervicalgia: Secondary | ICD-10-CM | POA: Diagnosis not present

## 2024-03-21 DIAGNOSIS — Z79899 Other long term (current) drug therapy: Secondary | ICD-10-CM | POA: Diagnosis not present

## 2024-04-20 DIAGNOSIS — I10 Essential (primary) hypertension: Secondary | ICD-10-CM | POA: Diagnosis not present

## 2024-04-20 DIAGNOSIS — E7800 Pure hypercholesterolemia, unspecified: Secondary | ICD-10-CM | POA: Diagnosis not present

## 2024-04-20 DIAGNOSIS — Z79899 Other long term (current) drug therapy: Secondary | ICD-10-CM | POA: Diagnosis not present
